# Patient Record
Sex: Female | Born: 1980 | Race: White | Hispanic: No | Marital: Married | Smoking: Never smoker
Health system: Southern US, Community
[De-identification: ages and names within clinical notes are randomized; demographics above are authoritative.]

## PROBLEM LIST (undated history)

## (undated) DIAGNOSIS — E119 Type 2 diabetes mellitus without complications: Secondary | ICD-10-CM

## (undated) DIAGNOSIS — F419 Anxiety disorder, unspecified: Secondary | ICD-10-CM

## (undated) DIAGNOSIS — E785 Hyperlipidemia, unspecified: Secondary | ICD-10-CM

## (undated) DIAGNOSIS — I10 Essential (primary) hypertension: Secondary | ICD-10-CM

## (undated) HISTORY — DX: Type 2 diabetes mellitus without complications: E11.9

## (undated) HISTORY — DX: Anxiety disorder, unspecified: F41.9

## (undated) HISTORY — DX: Essential (primary) hypertension: I10

## (undated) HISTORY — DX: Hyperlipidemia, unspecified: E78.5

---

## 2018-02-15 ENCOUNTER — Other Ambulatory Visit: Payer: Self-pay | Admitting: Obstetrics and Gynecology

## 2018-02-15 DIAGNOSIS — Z1231 Encounter for screening mammogram for malignant neoplasm of breast: Secondary | ICD-10-CM

## 2019-05-31 ENCOUNTER — Encounter: Payer: Self-pay | Admitting: *Deleted

## 2019-05-31 ENCOUNTER — Other Ambulatory Visit: Payer: Self-pay

## 2019-05-31 ENCOUNTER — Encounter: Payer: 59 | Attending: Certified Nurse Midwife | Admitting: *Deleted

## 2019-05-31 VITALS — BP 118/70 | Ht 65.0 in | Wt 260.7 lb

## 2019-05-31 DIAGNOSIS — E119 Type 2 diabetes mellitus without complications: Secondary | ICD-10-CM | POA: Diagnosis not present

## 2019-05-31 DIAGNOSIS — Z713 Dietary counseling and surveillance: Secondary | ICD-10-CM | POA: Diagnosis not present

## 2019-05-31 NOTE — Patient Instructions (Addendum)
Check blood sugars 1 x day before breakfast or 2 hrs after one meal 2-3 x week Bring blood sugar records to the next appointment  Exercise: Continue walking for 20 minutes  2-3 days a week and gradually increase to 30 minutes 5 x week  Eat 3 meals day, 1-2  snacks a day Space meals 4-6 hours apart Complete 3 Day Food Record and bring to next appt  Return for appointment on:  Monday June 24, 2019 at 8:30 am with the dietitian Jaclyn Shaggy)

## 2019-05-31 NOTE — Progress Notes (Signed)
Diabetes Self-Management Education  Visit Type: First/Initial  Appt. Start Time: 0850 Appt. End Time: 0955  05/31/2019  [name], identified by name and date of birth, is a 38 y.o. female with a diagnosis of Diabetes: Type 2.   ASSESSMENT  Blood pressure 118/70, height 5\' 5"  (1.651 m), weight 260 lb 11.2 oz (118.3 kg). Body mass index is 43.38 kg/m.  Diabetes Self-Management Education - 05/31/19 1046      Visit Information   Visit Type  First/Initial      Initial Visit   Diabetes Type  Type 2    Are you currently following a meal plan?  Yes    What type of meal plan do you follow?  "eating fruits/veggies, smaller portions"    Are you taking your medications as prescribed?  Yes    Date Diagnosed  "2 months" - A1C was 6.6 % on 05/29/18      Health Coping   How would you rate your overall health?  Good      Psychosocial Assessment   Patient Belief/Attitude about Diabetes  Other (comment)   "anxious, nervous"   Self-care barriers  None    Self-management support  Family;Doctor's office    Patient Concerns  Nutrition/Meal planning;Weight Control;Healthy Lifestyle    Special Needs  None    Preferred Learning Style  Auditory;Visual;Hands on    Learning Readiness  Change in progress    How often do you need to have someone help you when you read instructions, pamphlets, or other written materials from your doctor or pharmacy?  1 - Never    What is the last grade level you completed in school?  Bachelors      Pre-Education Assessment   Patient understands the diabetes disease and treatment process.  Needs Instruction    Patient understands incorporating nutritional management into lifestyle.  Needs Instruction    Patient undertands incorporating physical activity into lifestyle.  Needs Instruction    Patient understands using medications safely.  Needs Instruction    Patient understands monitoring blood glucose, interpreting and using results  Needs Review    Patient  understands prevention, detection, and treatment of acute complications.  Needs Instruction    Patient understands prevention, detection, and treatment of chronic complications.  Needs Instruction    Patient understands how to develop strategies to address psychosocial issues.  Needs Instruction    Patient understands how to develop strategies to promote health/change behavior.  Needs Instruction      Complications   Last HgB A1C per patient/outside source  6.7 %   04/05/2019   How often do you check your blood sugar?  3-4 times / week   Pt checking blood sugars 2-3 x week.   Fasting Blood glucose range (mg/dL)  [phone]   Pt reports fasting blood sugars 110's mg/dL.   Have you had a dilated eye exam in the past 12 months?  Yes    Have you had a dental exam in the past 12 months?  No    Are you checking your feet?  No      Dietary Intake   Breakfast  peanut butter on grain bread (toast), fruit (blueberries, pineapple, watermelon), eggs, yogurt    Snack (morning)  0-1 snacks - nuts, popcorn    Lunch  spaghettie squash with sauce, Lean Cuisine, left overs    Dinner  chicken, beef, pork - potatoes, beans, corn, rice, pasta, lettuce, tomatoes, occasional green beans, borccoli, cauliflower    Beverage(s)  water, no sugar energy drink      Exercise   Exercise Type  Light (walking / raking leaves)    How many days per week to you exercise?  2    How many minutes per day do you exercise?  20    Total minutes per week of exercise  40      Patient Education   Previous Diabetes Education  No    Disease state   Definition of diabetes, type 1 and 2, and the diagnosis of diabetes;Factors that contribute to the development of diabetes    Nutrition management   Role of diet in the treatment of diabetes and the relationship between the three main macronutrients and blood glucose level;Food label reading, portion sizes and measuring food.;Reviewed blood glucose goals for pre and post meals and how to  evaluate the patients' food intake on their blood glucose level.    Physical activity and exercise   Role of exercise on diabetes management, blood pressure control and cardiac health.    Monitoring  Purpose and frequency of SMBG.;Taught/discussed recording of test results and interpretation of SMBG.;Identified appropriate SMBG and/or A1C goals.    Chronic complications  Relationship between chronic complications and blood glucose control    Psychosocial adjustment  Identified and addressed patients feelings and concerns about diabetes    Preconception care  Pregnancy and GDM  Role of pre-pregnancy blood glucose control on the development of the fetus;Reviewed with patient blood glucose goals with pregnancy;Role of family planning for patients with diabetes   Pt is no longer on birth control and wants to get pregnant.     Individualized Goals (developed by patient)   Reducing Risk Lose weight Lead a healthier lifestyle     Outcomes   Expected Outcomes  Demonstrated interest in learning. Expect positive outcomes       Individualized Plan for Diabetes Self-Management Training:   Learning Objective:  Patient will have a greater understanding of diabetes self-management. Patient education plan is to attend individual and/or group sessions per assessed needs and concerns.   Plan:   Patient Instructions  Check blood sugars 1 x day before breakfast or 2 hrs after one meal 2-3 x week Bring blood sugar records to the next appointment Exercise: Continue walking for 20 minutes  2-3 days a week and gradually increase to 30 minutes 5 x week Eat 3 meals day, 1-2  snacks a day Space meals 4-6 hours apart Complete 3 Day Food Record and bring to next appt Return for appointment on:  Monday June 24, 2019 at 8:30 am with the dietitian Jaclyn Shaggy)  Expected Outcomes:  Demonstrated interest in learning. Expect positive outcomes  Education material provided:  General Meal Planning Guidelines Simple  Meal Plan 3 Day Food Record  If problems or questions, patient to contact team via:  Johny Drilling, RN, [place], CDE [phone]  Future DSME appointment:  June 24, 2019 with the dietitian

## 2019-06-24 ENCOUNTER — Encounter: Payer: Self-pay | Admitting: Dietician

## 2019-06-24 ENCOUNTER — Encounter: Payer: 59 | Attending: Certified Nurse Midwife | Admitting: Dietician

## 2019-06-24 ENCOUNTER — Other Ambulatory Visit: Payer: Self-pay

## 2019-06-24 VITALS — Ht 65.0 in | Wt 264.7 lb

## 2019-06-24 DIAGNOSIS — Z713 Dietary counseling and surveillance: Secondary | ICD-10-CM | POA: Diagnosis present

## 2019-06-24 DIAGNOSIS — E119 Type 2 diabetes mellitus without complications: Secondary | ICD-10-CM | POA: Insufficient documentation

## 2019-06-24 NOTE — Patient Instructions (Addendum)
Balance meals with 2-4 oz protein, 2-4 servings of carbohydrate (30-60 gms) and non-starchy vegetables. Spread 10-12 servings of carbohydrate over 3 meals and 2-3 small snacks. Read labels for carbohydrate. Measure out starch servings for several meals to be better able to judge amount. Use calorieking.com to look up especially  "take out" foods. Prepare more meals at home. Check blood sugars at least 3-4 days per week; fasting and 2 hours after dinner meals.

## 2019-06-24 NOTE — Progress Notes (Signed)
Diabetes Self-Management Education  Visit Type:  Follow-up  Appt. Start Time: 8:40   Appt. End Time: 9:40  06/24/2019  [name], identified by name and date of birth, is a 38 y.o. female with a diagnosis of Diabetes:Type 2 Diabetes ASSESSMENT  Height 5\' 5"  (1.651 m), weight 264 lb 11.2 oz (120.1 kg). Body mass index is 44.05 kg/m.   Diabetes Self-Management Education - 06/24/19 1046      Complications   Last HgB A1C per patient/outside source  6.7 %    How often do you check your blood sugar?  --   once weekly   Fasting Blood glucose range (mg/dL)  [phone]   based on 2 fasting blood sugars   Postprandial Blood glucose range (mg/dL)  [phone]   one reading   Have you had a dilated eye exam in the past 12 months?  No    Have you had a dental exam in the past 12 months?  No    Are you checking your feet?  No      Dietary Intake   Breakfast  9:30am- cereal/milk (1.5 -2 cups), cottage cheese, blueberries/cherries, water    Snack (morning)  usually no snack    Lunch  2:00pm- 3-4 oz chicken breast with b'que sauce, broccoli, hibachi soup, water or chipotle chicken bowl with rice and tortilla chips    Snack (afternoon)  2 handfuls chex mix    Dinner  8-9:00 individual "frozen pizza" or hibachi chicken with rice and vegetables.    Snack (evening)  no snack    Beverage(s)  water with sugar free flavored packets      Exercise   Exercise Type  Light (walking / raking leaves)    How many days per week to you exercise?  3    How many minutes per day do you exercise?  20    Total minutes per week of exercise  60      Patient Education   Nutrition management   Role of diet in the treatment of diabetes and the relationship between the three main macronutrients and blood glucose level;Food label reading, portion sizes and measuring food.;Carbohydrate counting;Reviewed blood glucose goals for pre and post meals and how to evaluate the patients' food intake on their blood glucose  level.;Effects of alcohol on blood glucose and safety factors with consumption of alcohol.    Physical activity and exercise   Role of exercise on diabetes management, blood pressure control and cardiac health.    Monitoring  Purpose and frequency of SMBG.;Taught/discussed recording of test results and interpretation of SMBG.;Identified appropriate SMBG and/or A1C goals.    Preconception care  Role of family planning for patients with diabetes;Pregnancy and GDM  Role of pre-pregnancy blood glucose control on the development of the fetus      Individualized Goals (developed by patient)   Nutrition  Follow meal plan discussed;General guidelines for healthy choices and portions discussed    Physical Activity  Exercise 3-5 times per week    Monitoring   test my blood glucose as discussed      Post-Education Assessment   Patient understands the diabetes disease and treatment process.  Demonstrates understanding / competency    Patient understands incorporating nutritional management into lifestyle.  Demonstrates understanding / competency    Patient undertands incorporating physical activity into lifestyle.  Demonstrates understanding / competency    Patient understands monitoring blood glucose, interpreting and using results  Demonstrates understanding / competency    Patient  understands prevention, detection, and treatment of acute complications.  Demonstrates understanding / competency    Patient understands how to develop strategies to promote health/change behavior.  Demonstrates understanding / competency      Outcomes   Program Status  Completed       Learning Objective:  Patient will have a greater understanding of diabetes self-management. Patient education plan is to attend individual and/or group sessions per assessed needs and concerns. Education: Patient was instructed on a meal plan for diabetes based on 1700 calories including carbohydrate counting and how to better balance  carbohydrate protein, fat and non-starchy vegetables. She stated that she is trying to get pregnant. Stressed important nutrients for pregnancy including protein, calcium, iron and folic acid and instructed on food sources. Also, stressed importance of good glucose control at time of conception as well as throughout pregnancy and stressed importance of monitoring blood glucose. Reviewed blood glucose goals. Reviewed food record with patient and discussed ways to adjust choices and portions to help improve blood glucose.   Patient Instructions  Balance meals with 2-4 oz protein, 2-4 servings of carbohydrate (30-60 gms) and non-starchy vegetables. Spread 10-12 servings of carbohydrate over 3 meals and 2-3 small snacks. Read labels for carbohydrate. Measure out starch servings for several meals to be better able to judge amount. Use calorieking.com to look up especially  "take out" foods. Prepare more meals at home. Check blood sugars at least 3-4 days per week; fasting and 2 hours after dinner meals.   Expected Outcomes:  Demonstrated interest in learning. Expect positive outcomes  Education material provided: Food Guide Plate Planning a Balanced Meal Combination Foods Food Safety during pregnancy Key Nutrients for Healthy Pregnancy  If problems or questions, patient to contact team via:  Karolee Stamps, [place] Future DSME appointment: -  None scheduled. Patient was encouraged to call if desires further help with her diet/nutrition.

## 2020-08-26 ENCOUNTER — Emergency Department: Payer: 59

## 2020-08-26 ENCOUNTER — Emergency Department
Admission: EM | Admit: 2020-08-26 | Discharge: 2020-08-26 | Disposition: A | Discharge status: Home / Self Care | Payer: 59 | Attending: Emergency Medicine | Admitting: Emergency Medicine

## 2020-08-26 ENCOUNTER — Other Ambulatory Visit: Payer: Self-pay

## 2020-08-26 ENCOUNTER — Encounter: Payer: Self-pay | Admitting: Emergency Medicine

## 2020-08-26 DIAGNOSIS — O26891 Other specified pregnancy related conditions, first trimester: Secondary | ICD-10-CM | POA: Diagnosis not present

## 2020-08-26 DIAGNOSIS — Z3A01 Less than 8 weeks gestation of pregnancy: Secondary | ICD-10-CM | POA: Insufficient documentation

## 2020-08-26 DIAGNOSIS — Z79899 Other long term (current) drug therapy: Secondary | ICD-10-CM | POA: Insufficient documentation

## 2020-08-26 DIAGNOSIS — O24419 Gestational diabetes mellitus in pregnancy, unspecified control: Secondary | ICD-10-CM | POA: Diagnosis not present

## 2020-08-26 DIAGNOSIS — Z3491 Encounter for supervision of normal pregnancy, unspecified, first trimester: Secondary | ICD-10-CM

## 2020-08-26 DIAGNOSIS — O10011 Pre-existing essential hypertension complicating pregnancy, first trimester: Secondary | ICD-10-CM | POA: Insufficient documentation

## 2020-08-26 DIAGNOSIS — R109 Unspecified abdominal pain: Secondary | ICD-10-CM

## 2020-08-26 DIAGNOSIS — R1032 Left lower quadrant pain: Secondary | ICD-10-CM | POA: Diagnosis not present

## 2020-08-26 LAB — URINALYSIS, COMPLETE (UACMP) WITH MICROSCOPIC
Bilirubin Urine: NEGATIVE
Glucose, UA: NEGATIVE mg/dL
Hgb urine dipstick: NEGATIVE
Ketones, ur: 20 mg/dL — AB
Leukocytes,Ua: NEGATIVE
Nitrite: NEGATIVE
Protein, ur: 30 mg/dL — AB
Specific Gravity, Urine: 1.03 (ref 1.005–1.030)
pH: 5 (ref 5.0–8.0)

## 2020-08-26 LAB — COMPREHENSIVE METABOLIC PANEL
ALT: 45 U/L — ABNORMAL HIGH (ref 0–44)
AST: 27 U/L (ref 15–41)
Albumin: 4.4 g/dL (ref 3.5–5.0)
Alkaline Phosphatase: 50 U/L (ref 38–126)
Anion gap: 12 (ref 5–15)
BUN: 15 mg/dL (ref 6–20)
CO2: 20 mmol/L — ABNORMAL LOW (ref 22–32)
Calcium: 9.7 mg/dL (ref 8.9–10.3)
Chloride: 103 mmol/L (ref 98–111)
Creatinine, Ser: 0.81 mg/dL (ref 0.44–1.00)
GFR, Estimated: >60 mL/min (ref >60)
Glucose, Bld: 178 mg/dL — ABNORMAL HIGH (ref 70–99)
Potassium: 4 mmol/L (ref 3.5–5.1)
Sodium: 135 mmol/L (ref 135–145)
Total Bilirubin: 0.8 mg/dL (ref 0.3–1.2)
Total Protein: 8.3 g/dL — ABNORMAL HIGH (ref 6.5–8.1)

## 2020-08-26 LAB — CBC
HCT: 41.8 % (ref 36.0–46.0)
Hemoglobin: 14 g/dL (ref 12.0–15.0)
MCH: 29.5 pg (ref 26.0–34.0)
MCHC: 33.5 g/dL (ref 30.0–36.0)
MCV: 88 fL (ref 80.0–100.0)
Platelets: 339 10*3/uL (ref 150–400)
RBC: 4.75 MIL/uL (ref 3.87–5.11)
RDW: 13.2 % (ref 11.5–15.5)
WBC: 17 10*3/uL — ABNORMAL HIGH (ref 4.0–10.5)
nRBC: 0 % (ref 0.0–0.2)

## 2020-08-26 LAB — HCG, QUANTITATIVE, PREGNANCY: hCG, Beta Chain, Quant, S: 45465 m[IU]/mL — ABNORMAL HIGH (ref <5)

## 2020-08-26 LAB — POCT PREGNANCY, URINE: Preg Test, Ur: POSITIVE — AB

## 2020-08-26 IMAGING — US US OB < 14 WEEKS - US OB TV
1 series · 14 of 28 positions shown · non-contrast
Comparison: None.

CLINICAL DATA: Left upper quadrant, flank pain

EXAM:
OBSTETRIC <14 WK US AND TRANSVAGINAL OB US
TECHNIQUE: Both transabdominal and transvaginal ultrasound examinations were
performed for complete evaluation of the gestation as well as the
maternal uterus, adnexal regions, and pelvic cul-de-sac.
Transvaginal technique was performed to assess early pregnancy.

[Series 1: us ob less than 14 weeks with ob transvaginal · 88 acquisitions, 14 frames shown]
[im 4/88]
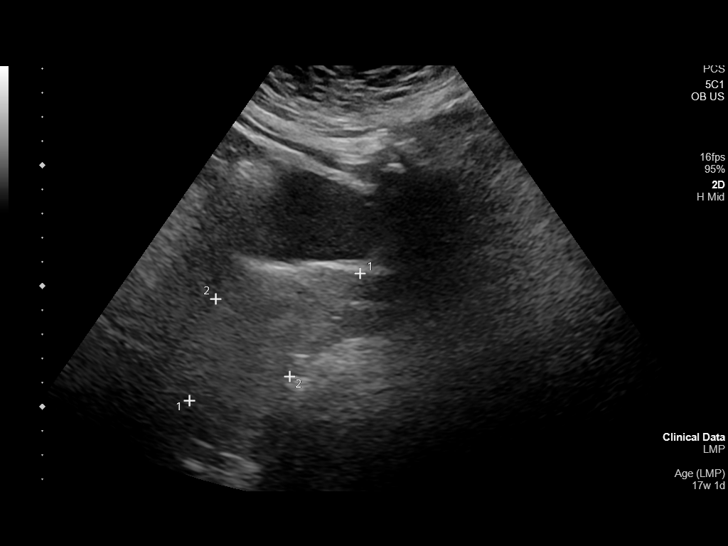
[im 10/88]
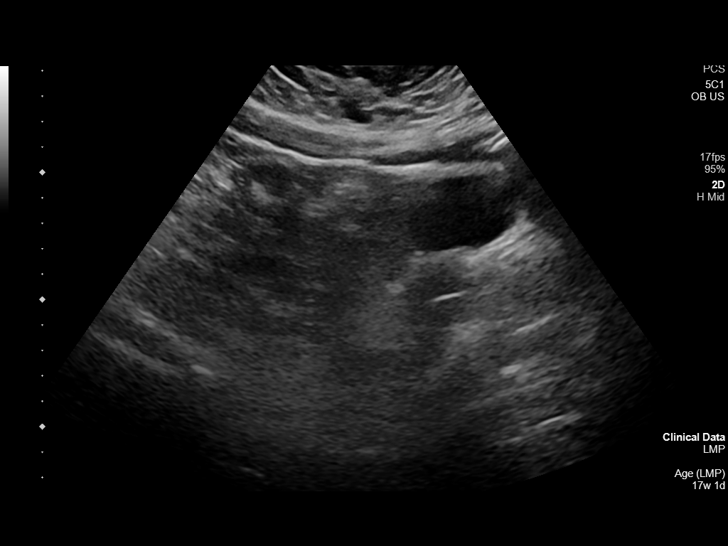
[im 17/88]
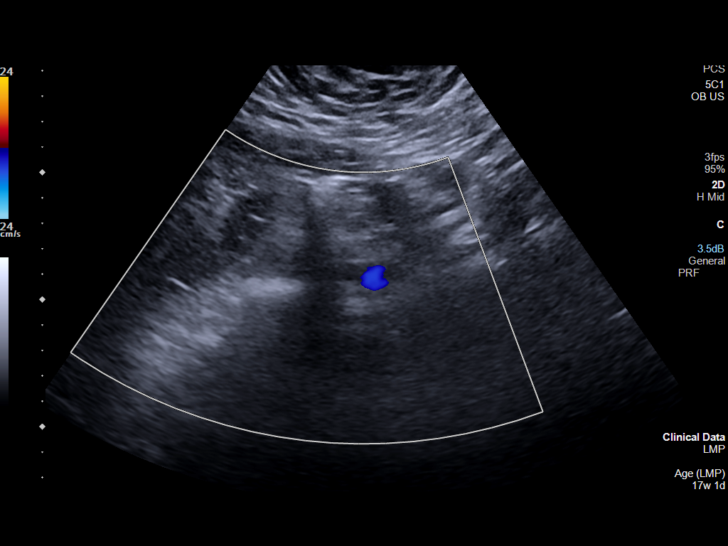
[im 23/88]
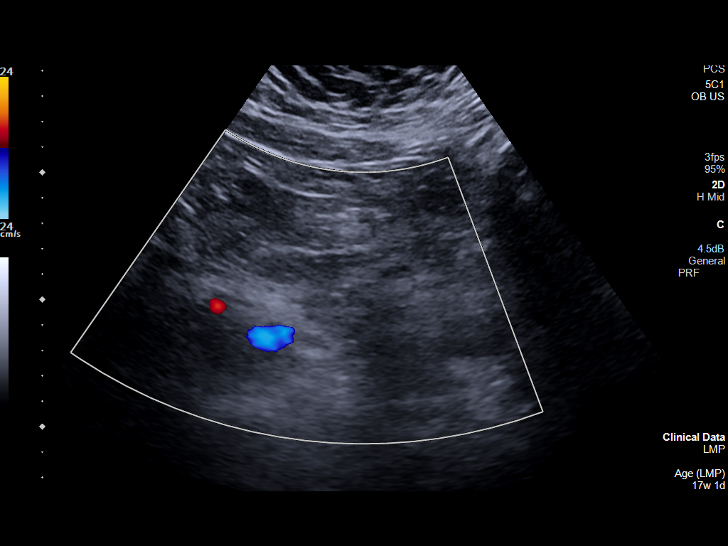
[im 30/88]
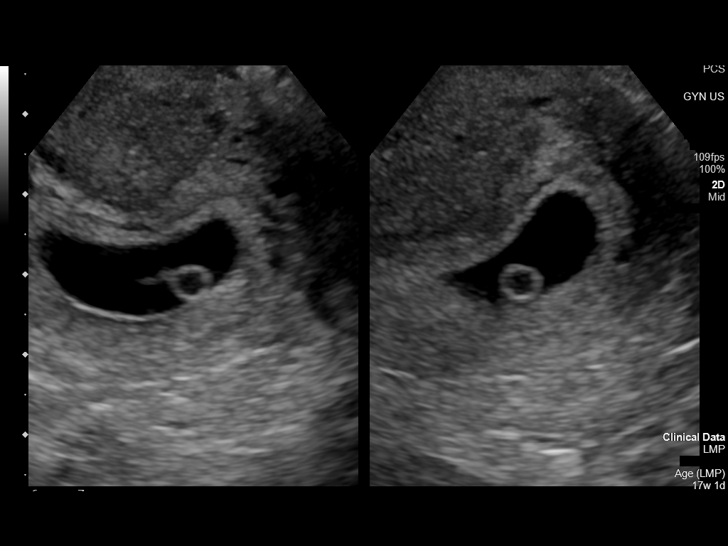
[im 36/88]
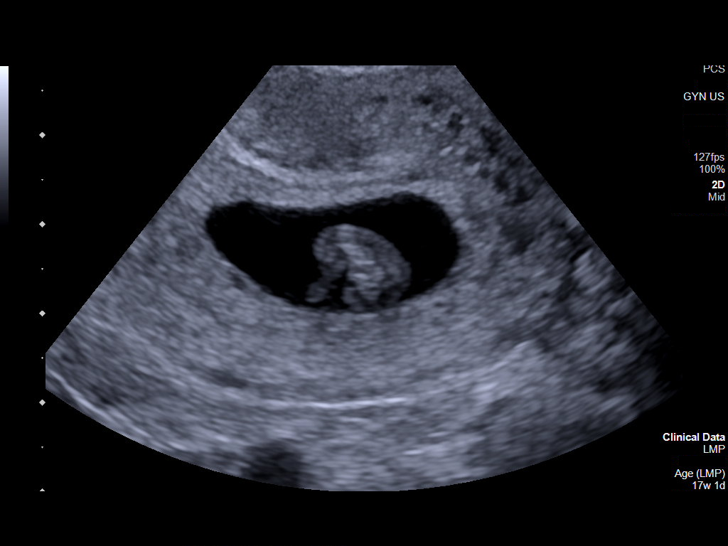
[im 42/88]
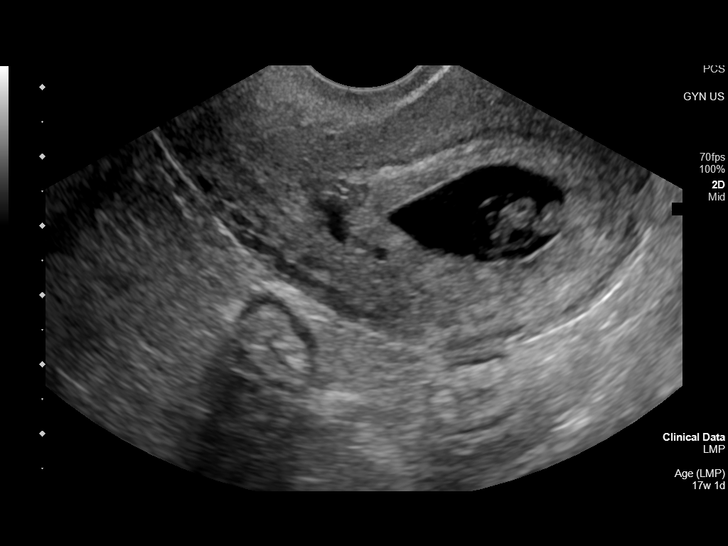
[im 49/88]
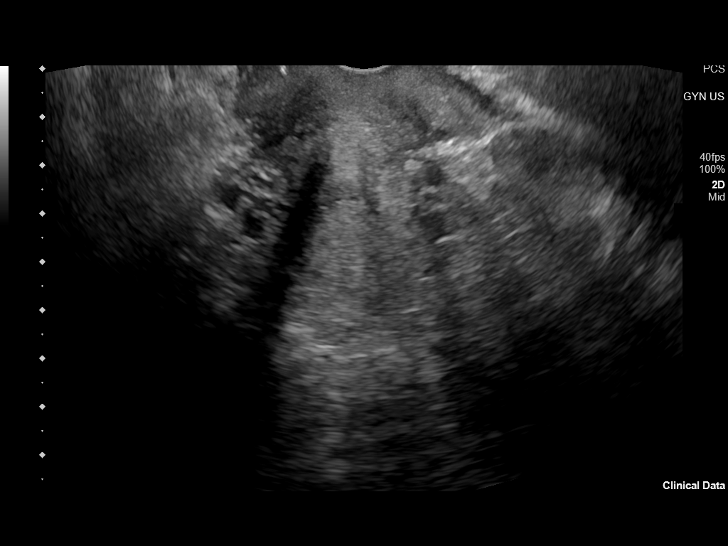
[im 55/88]
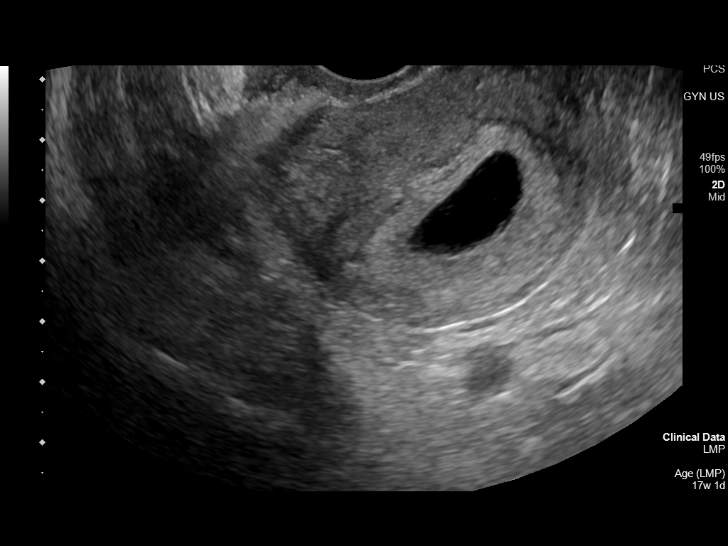
[im 62/88]
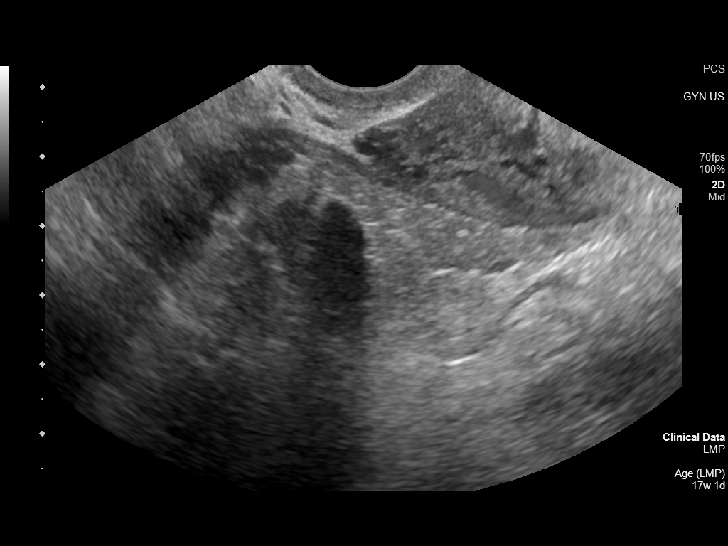
[im 68/88]
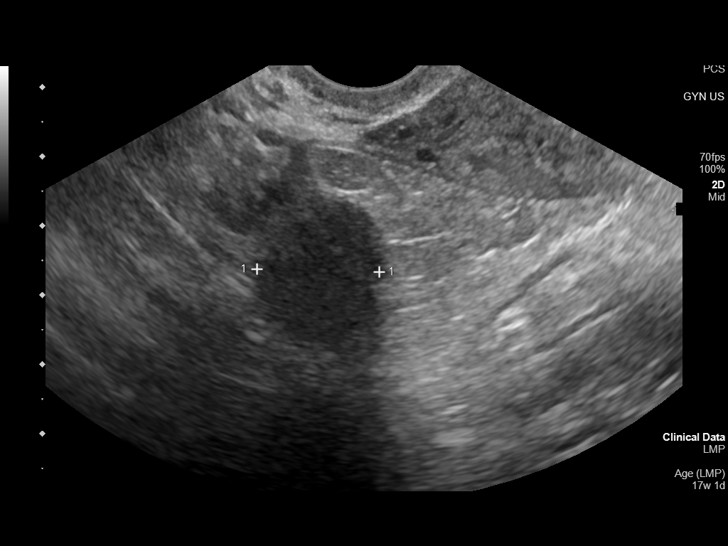
[im 75/88]
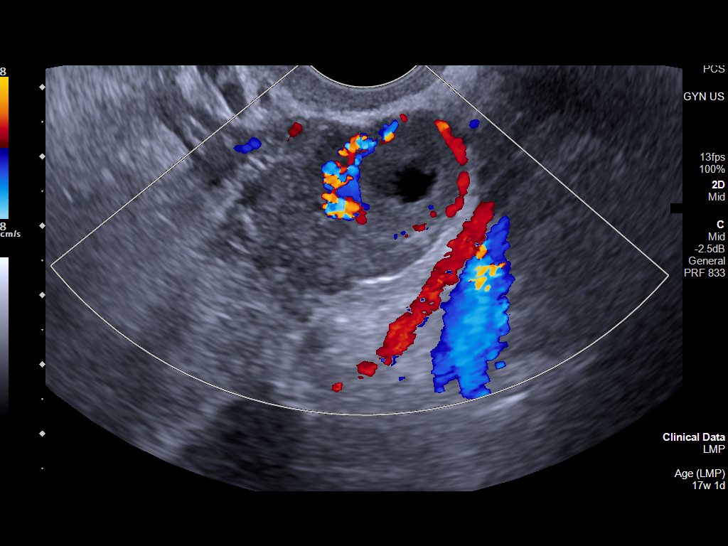
[im 81/88]
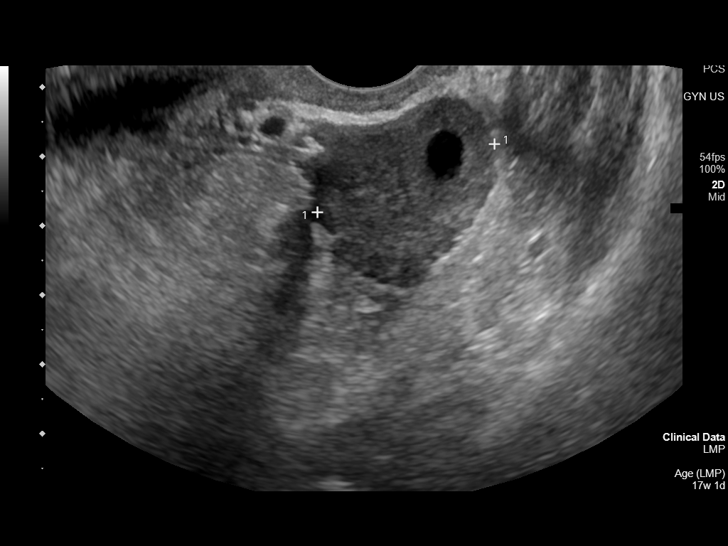
[im 88/88]
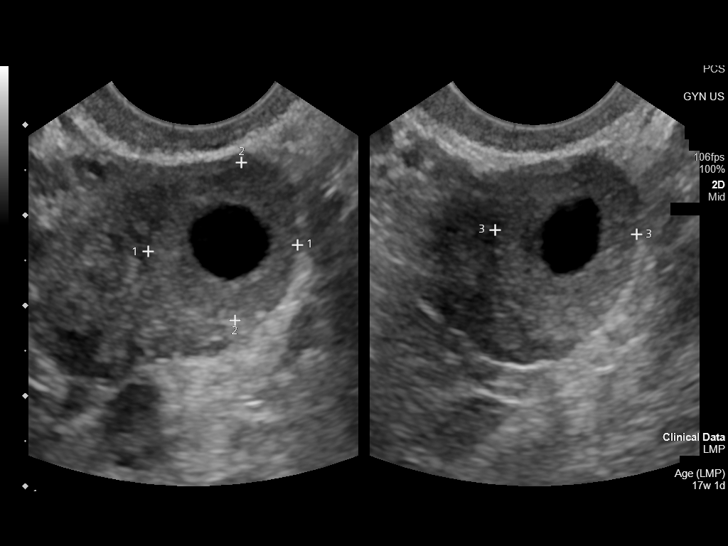

[14 of 28 positions shown; findings below may reference images not displayed]

FINDINGS: Intrauterine gestational sac: Single

Yolk sac:  Visualized.

Embryo:  Visualized.

Cardiac Activity: Visualized.

Heart Rate: 155 bpm

CRL:  11.4 mm   7 w   2 d                  US EDC: [DATE]

Subchorionic hemorrhage:  Small volume subchorionic hemorrhage.

Maternal uterus/adnexae: Retroverted uterus. No other significant
maternal uterine abnormality. Normal appearance of the right ovary.
Thick-walled cyst in the left ovary with peripheral color flow
measuring up to 1.8 cm, likely corpus luteum. No concerning adnexal
lesions. No free fluid.
IMPRESSION: Single intrauterine gestation at 7 weeks, 2 days gestational age by
crown-rump length sonographic estimation.

Small volume subchorionic hemorrhage.

Retroverted uterus.

## 2020-08-26 IMAGING — US US RENAL
1 series · 15 of 25 positions shown · non-contrast
Comparison: None.

CLINICAL DATA: Left flank pain, urinary frequency

EXAM:
RENAL / URINARY TRACT ULTRASOUND COMPLETE

[Series 1: us renal · 15 of 43 slices shown]
[im 1/43]
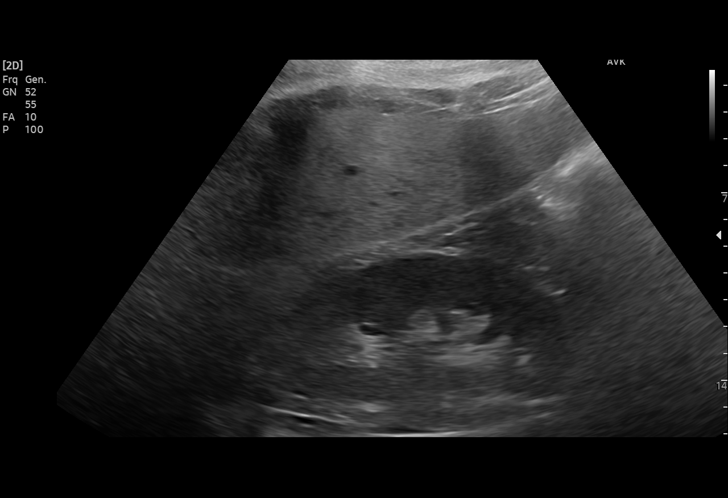
[im 4/43]
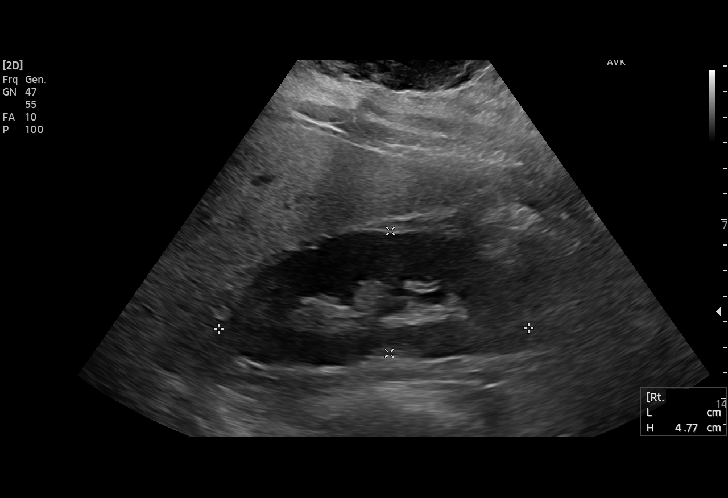
[im 8/43]
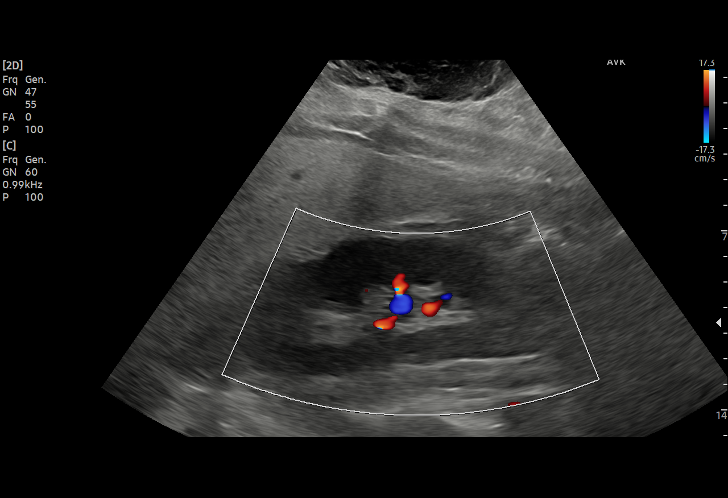
[im 9/43]
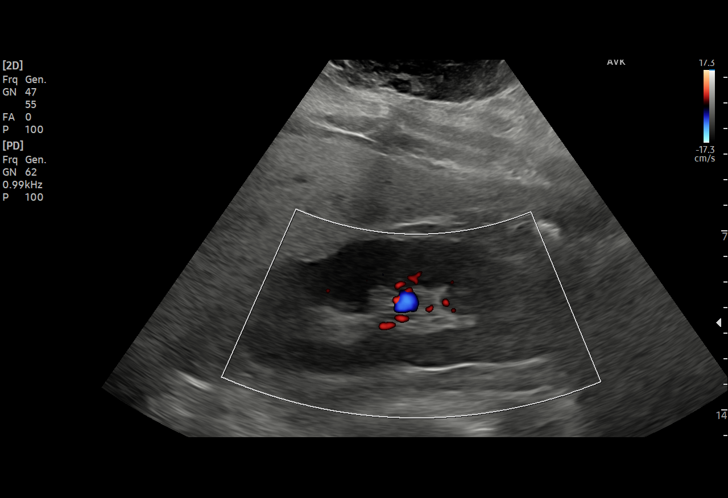
[im 13/43]
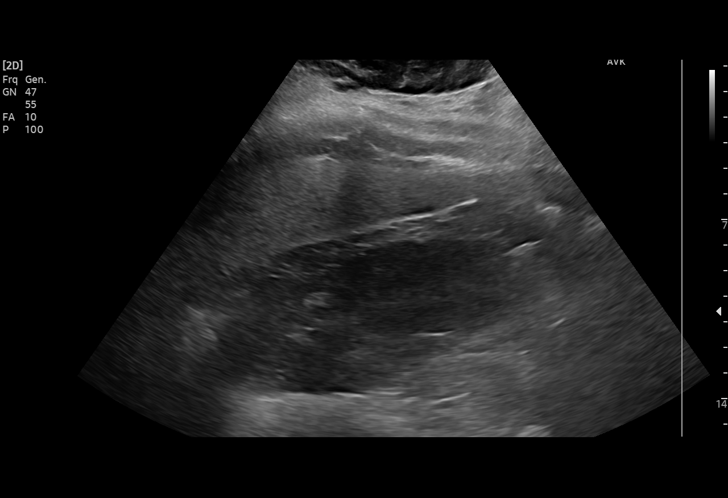
[im 16/43]
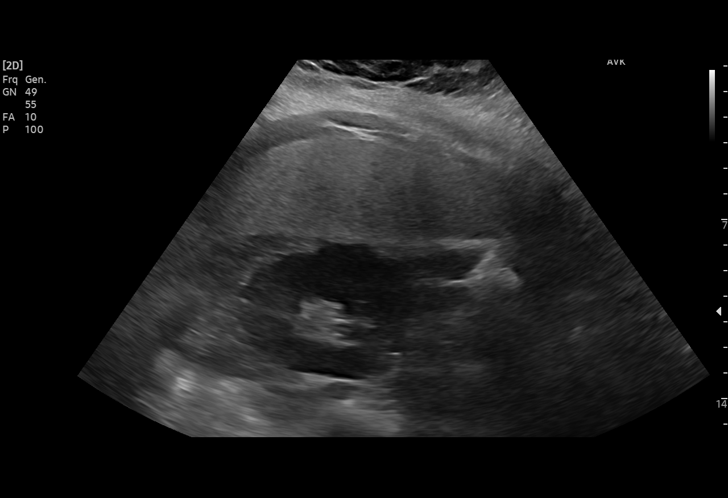
[im 18/43]
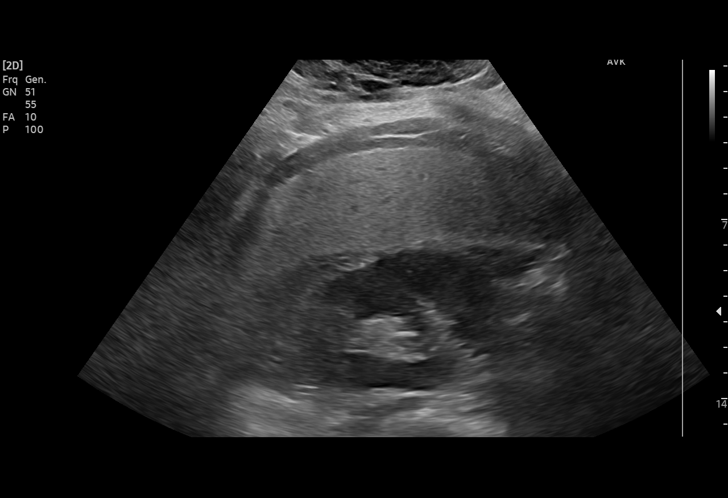
[im 22/43]
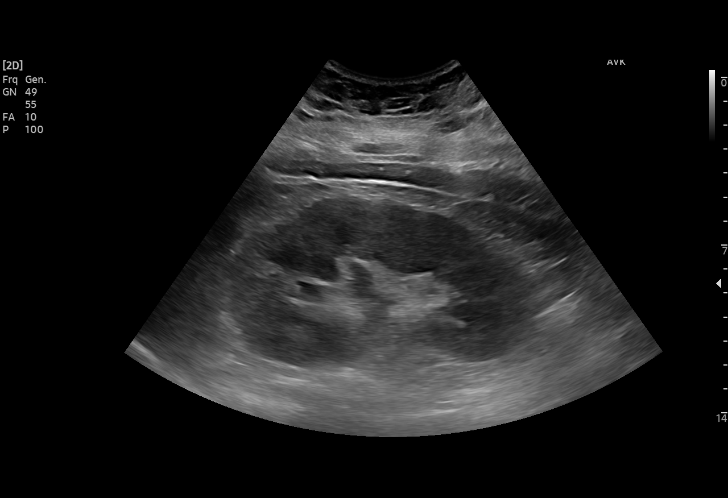
[im 25/43]
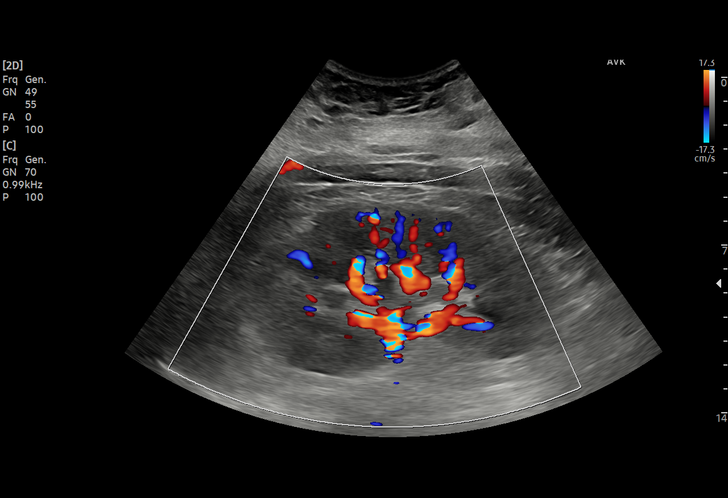
[im 27/43]
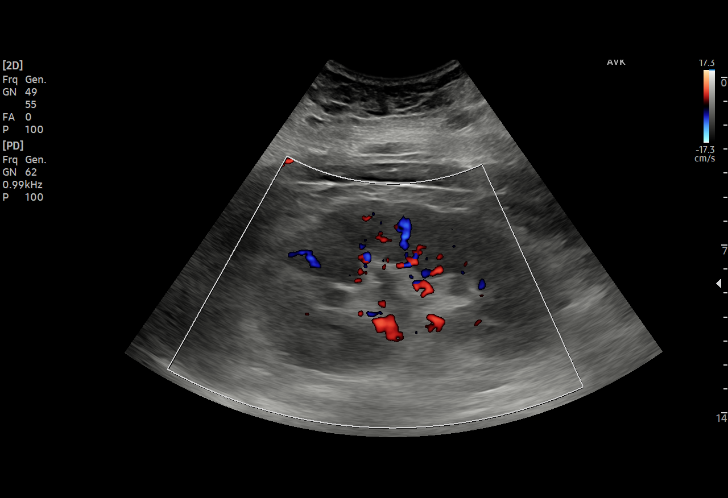
[im 30/43]
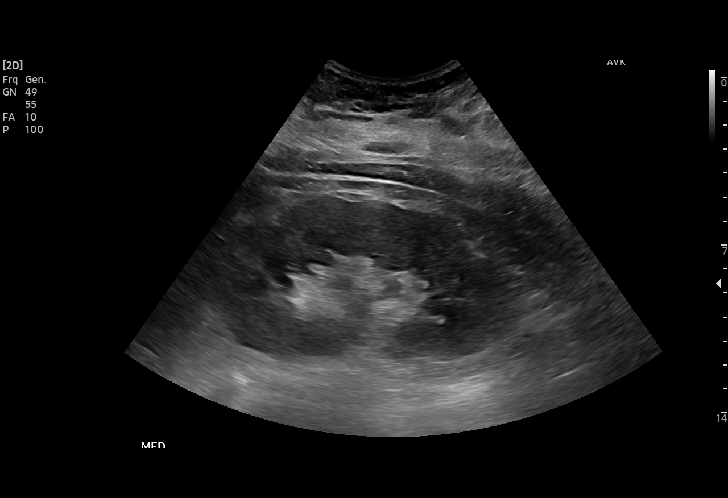
[im 34/43]
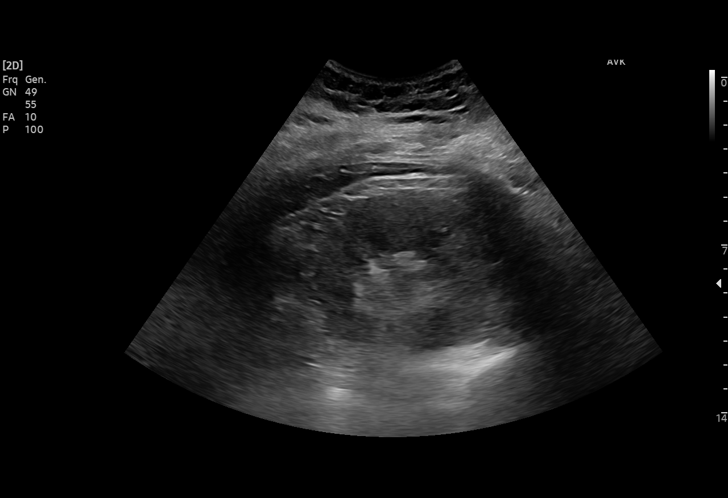
[im 36/43]
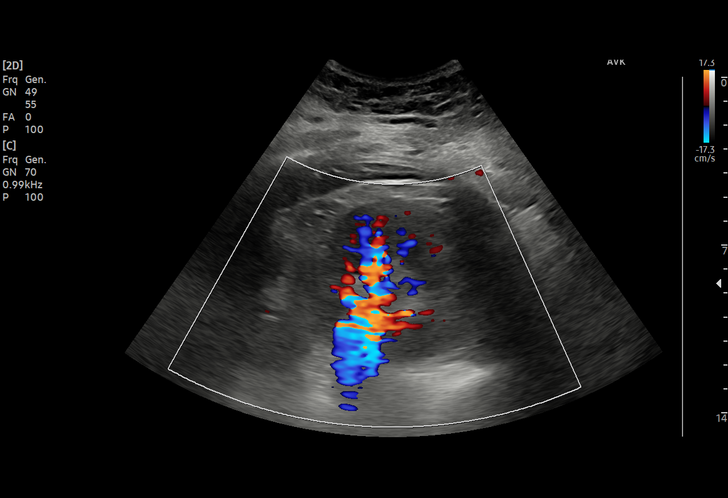
[im 39/43]
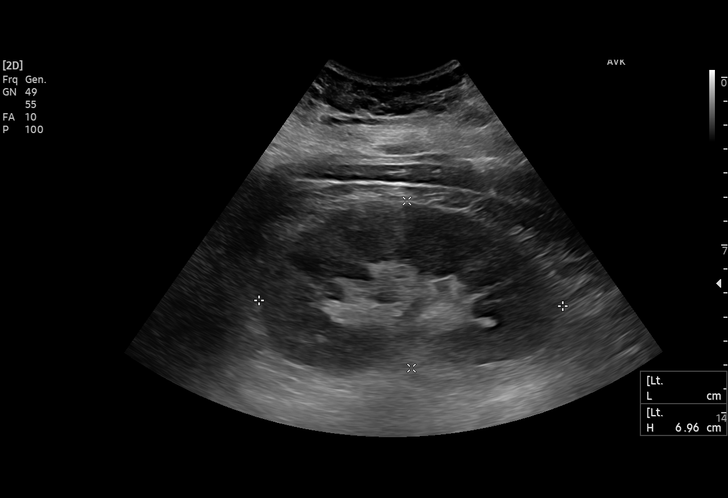
[im 43/43]
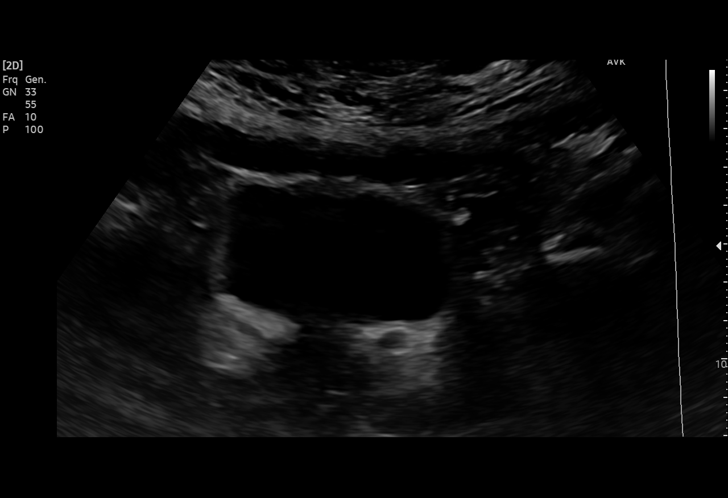

[15 of 25 positions shown; findings below may reference images not displayed]

FINDINGS: Right Kidney:

Renal measurements: 12.1 x 4.8 x 6.5 cm = volume: 195 mL.
Echogenicity within normal limits. No mass, shadowing stone, or
hydronephrosis visualized.

Left Kidney:

Renal measurements: 12.6 x 7.0 x 6.1 cm = volume: 282 mL.
Echogenicity within normal limits. No mass, shadowing stone, or
hydronephrosis visualized.

Bladder:

Appears normal for degree of bladder distention.

Other:

The visualized portion of the hepatic parenchyma appears diffusely
echogenic.
IMPRESSION: 1. Normal sonographic appearance of the kidneys and bladder without
evidence of obstructive uropathy.
2. Visualized portion of the liver is diffusely echogenic. Findings
are nonspecific but most commonly seen with hepatic steatosis.

## 2020-08-26 IMAGING — MR MR ABDOMEN W/O CM
1 series · 48 of 48 positions shown · non-contrast
Comparison: None.

CLINICAL DATA: Left lower quadrant abdominal pain

EXAM:
MRI ABDOMEN AND PELVIS WITHOUT CONTRAST
TECHNIQUE: Multiplanar multisequence MR imaging of the abdomen and pelvis was
performed. No intravenous contrast was administered.

[Series 8: T1 dynamic fat-sat · axial · 3.0mm · 1.12mm/px · z∈[-299,+196]mm · 48 of 176 slices shown]
[im 1/176]
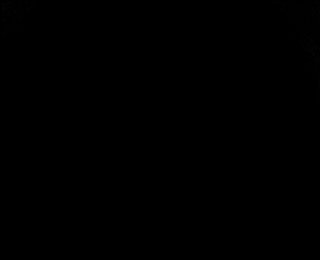
[im 4/176]
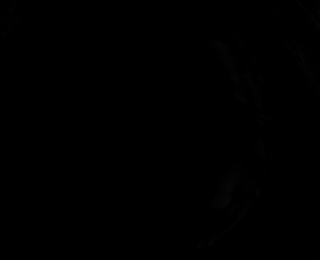
[im 8/176]
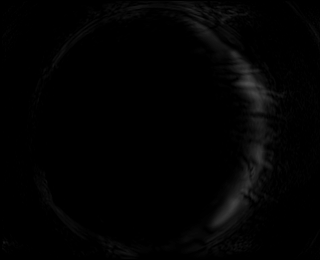
[im 12/176]
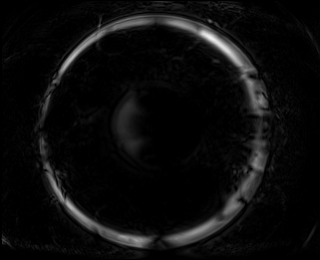
[im 15/176]
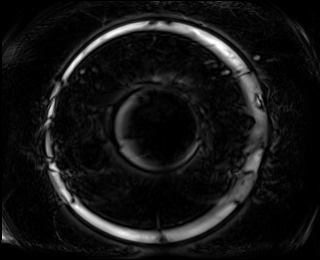
[im 19/176]
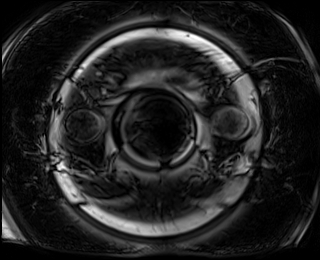
[im 23/176]
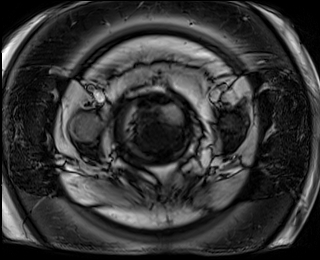
[im 27/176]
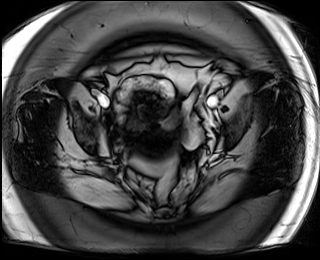
[im 30/176]
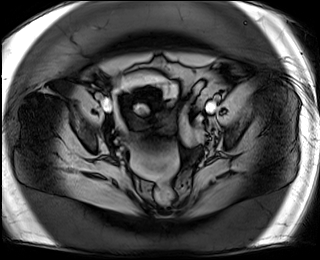
[im 34/176]
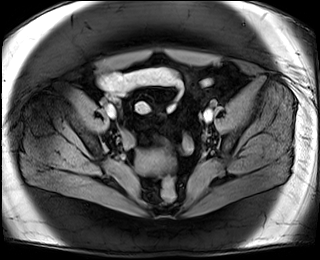
[im 38/176]
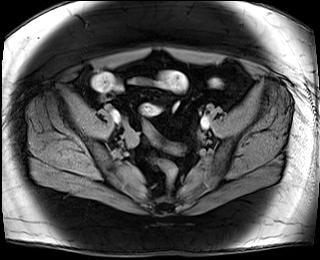
[im 41/176]
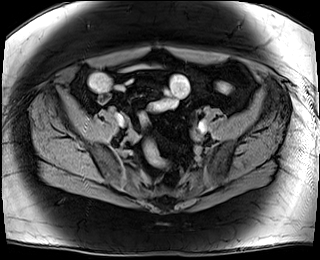
[im 45/176]
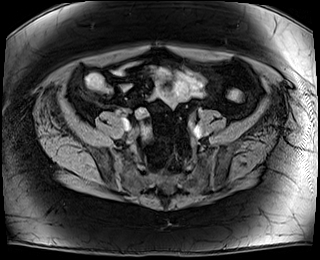
[im 49/176]
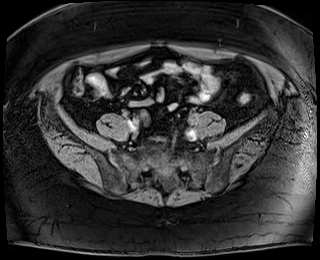
[im 53/176]
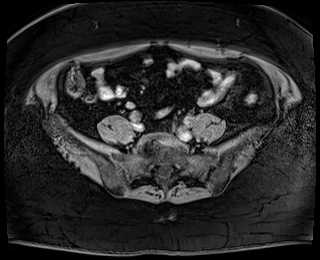
[im 56/176]
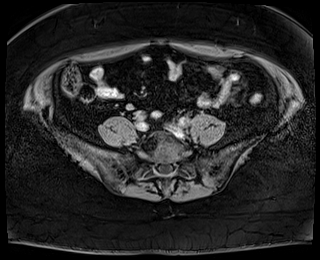
[im 60/176]
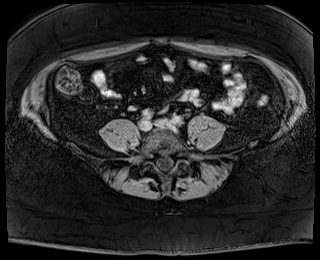
[im 64/176]
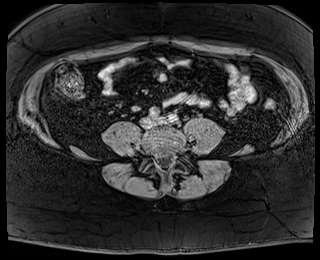
[im 68/176]
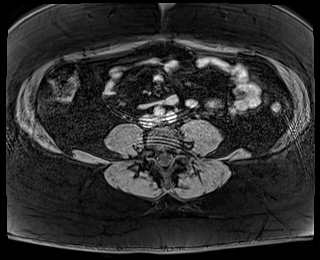
[im 71/176]
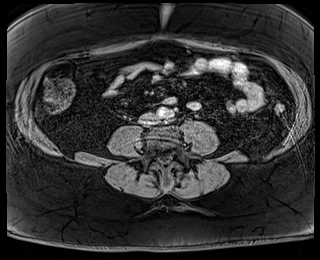
[im 75/176]
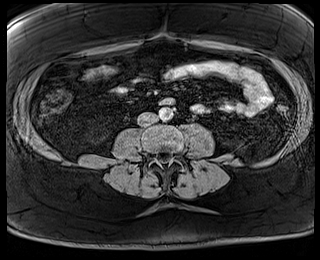
[im 79/176]
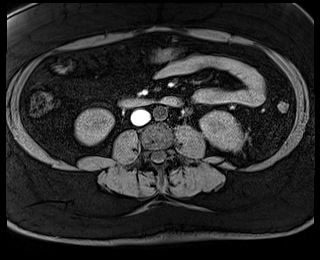
[im 82/176]
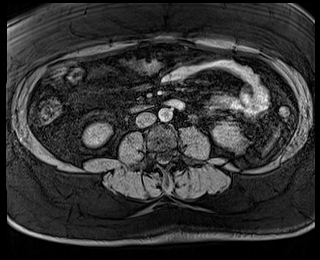
[im 86/176]
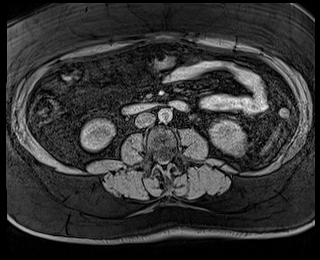
[im 90/176]
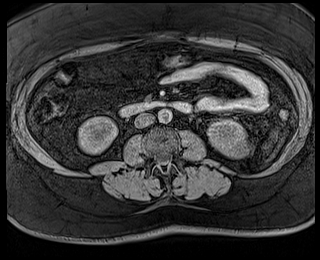
[im 94/176]
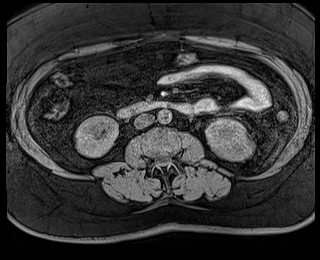
[im 97/176]
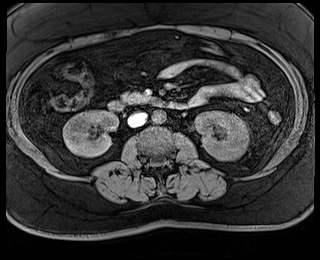
[im 101/176]
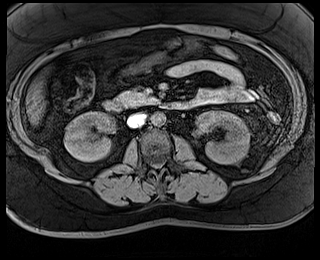
[im 105/176]
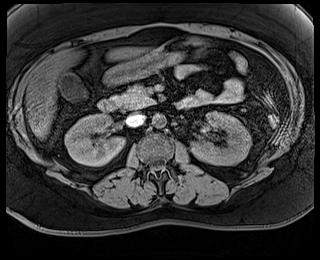
[im 108/176]
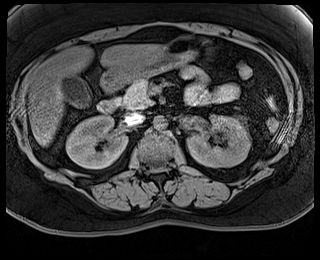
[im 112/176]
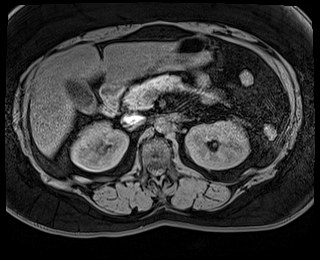
[im 116/176]
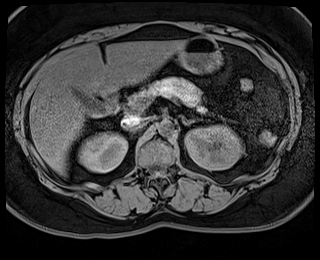
[im 120/176]
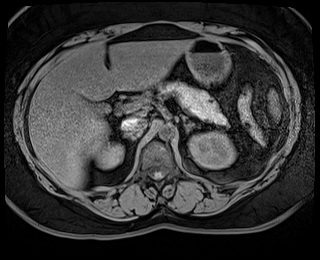
[im 123/176]
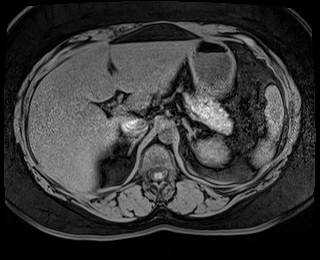
[im 127/176]
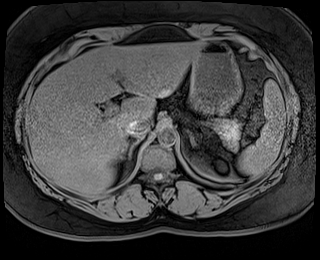
[im 131/176]
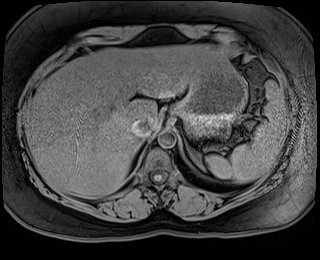
[im 135/176]
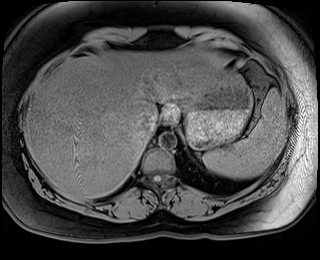
[im 138/176]
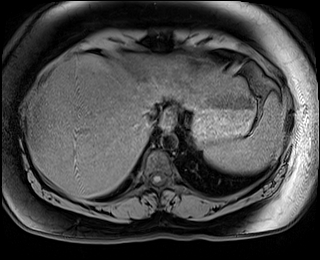
[im 142/176]
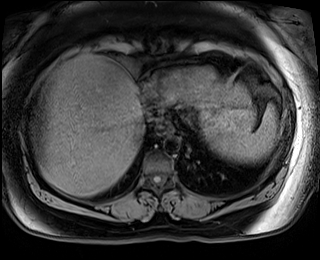
[im 146/176]
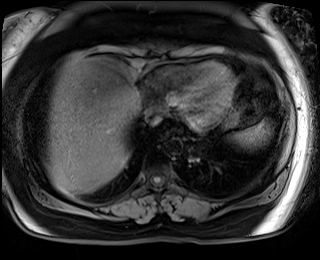
[im 149/176]
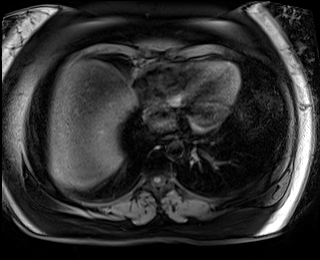
[im 153/176]
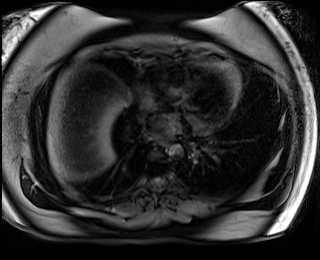
[im 157/176]
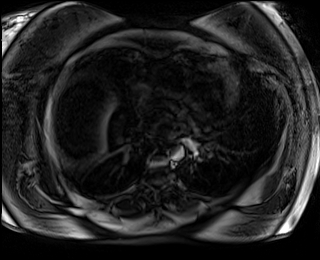
[im 161/176]
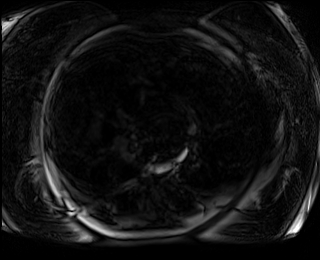
[im 164/176]
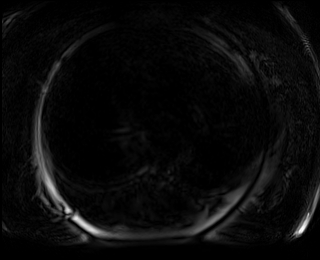
[im 168/176]
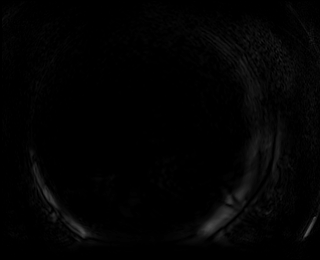
[im 172/176]
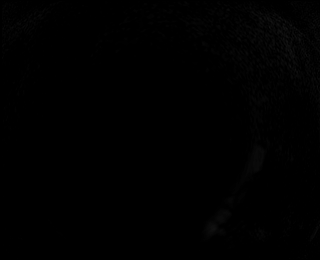
[im 176/176]
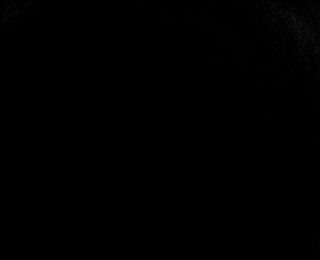

[48 of 48 positions shown; findings below may reference images not displayed]

FINDINGS: COMBINED FINDINGS FOR BOTH MR ABDOMEN AND PELVIS

Lower chest: No acute findings.

Hepatobiliary: No mass or other parenchymal abnormality identified.
The gallbladder is unremarkable. No gallstones identified. No
gallbladder wall thickening or biliary ductal dilatation.

Pancreas: No pancreatic inflammation, mass, or main duct dilatation.

Spleen:  Within normal limits in size and appearance.

Adrenals/Urinary Tract:  Normal appearance of the adrenal glands.

There is a normal appearance of the right kidney. Asymmetric left
nephromegaly, mild hydronephrosis and perinephric fat stranding.
Left hydroureter to the urinary bladder is noted.

Stomach/Bowel: Stomach is normal. The appendix is visualized and is
unremarkable. No bowel wall thickening, inflammation or distension.

Vascular/Lymphatic: No pathologically enlarged lymph nodes
identified. No abdominal aortic aneurysm demonstrated.

Reproductive: Gravid uterus containing gestational sac is
identified. Possible fetal pole within the gestational sac measuring
approximately 6 mm. No adnexal mass.

Other:  No free fluid or fluid collection

Musculoskeletal: No suspicious bone lesions identified.
IMPRESSION: 1. Normal appendix.
2. Asymmetric left nephromegaly, mild hydronephrosis, perinephric
fat stranding and hydroureter to the level of the urinary bladder.
Findings are likely secondary to either ureteral calculi or
pyelonephritis. In the absence of signs or symptoms of infection,
small distal ureteral calculi or recently passed calculi is
suspected. Careful clinical correlation advised.
3. Gravid uterus containing gestational sac. Possible fetal pole
within the gestational sac measuring approximately 6 mm.

## 2020-08-26 IMAGING — MR MR PELVIS W/O CM
8 of 11 series · 34 of 48 positions shown · non-contrast
Comparison: None.

CLINICAL DATA: Left lower quadrant abdominal pain

EXAM:
MRI ABDOMEN AND PELVIS WITHOUT CONTRAST
TECHNIQUE: Multiplanar multisequence MR imaging of the abdomen and pelvis was
performed. No intravenous contrast was administered.

[Series 4: cor haste · coronal · 5.0mm · 1.25mm/px · 4 of 44 slices shown]
[im 1/44]
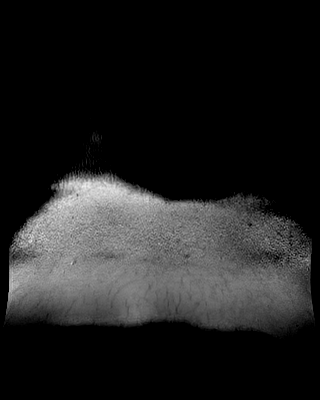
[im 15/44]
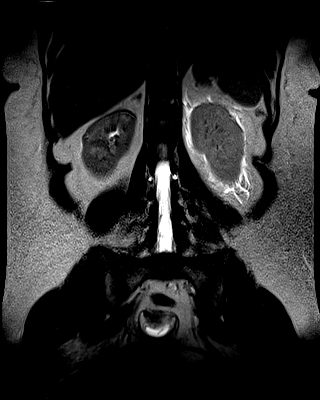
[im 29/44]
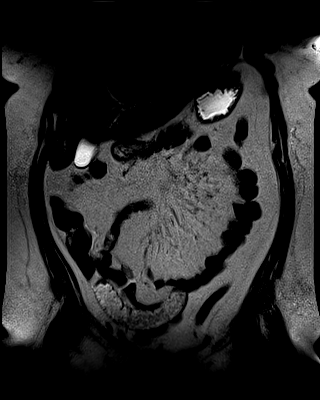
[im 44/44]
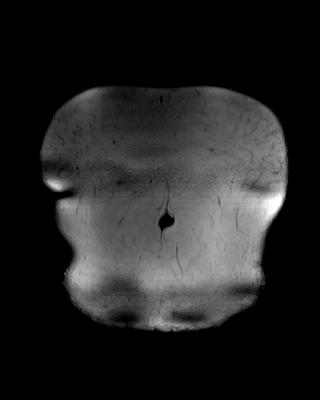

[Series 5: T2 · axial · 4.0mm · 1.00mm/px · z∈[-352,-82]mm · 5 of 55 slices shown (1 of 2)]
[im 1/55]
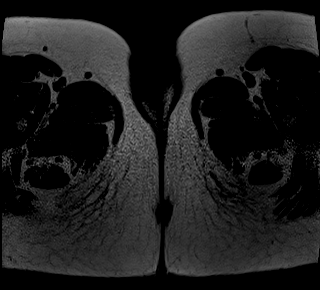
[im 14/55]
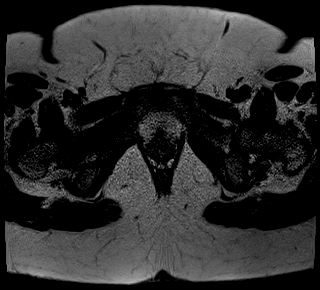
[im 28/55]
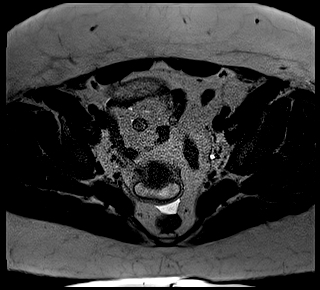
[im 41/55]
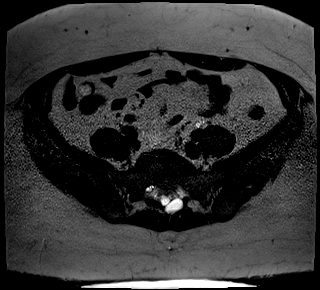
[im 55/55]
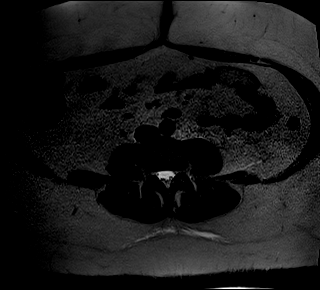

[Series 6: T2 fat-sat · axial · 4.0mm · 1.00mm/px · z∈[-352,-82]mm · 4 of 55 slices shown]
[im 1/55]
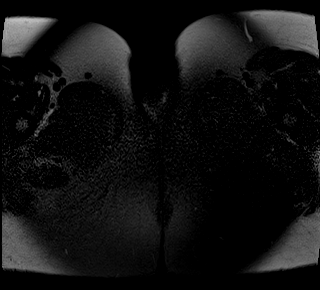
[im 19/55]
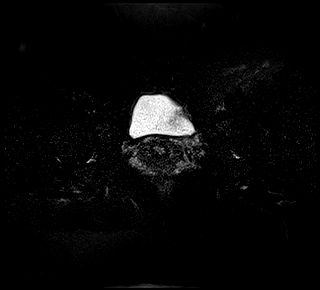
[im 37/55]
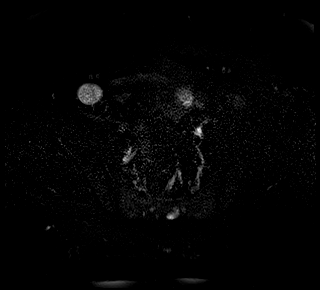
[im 55/55]
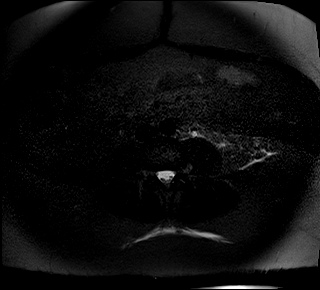

[Series 7: bSSFP · axial · 4.0mm · 0.62mm/px · z∈[-352,-82]mm · 4 of 55 slices shown (1 of 2)]
[im 1/55]
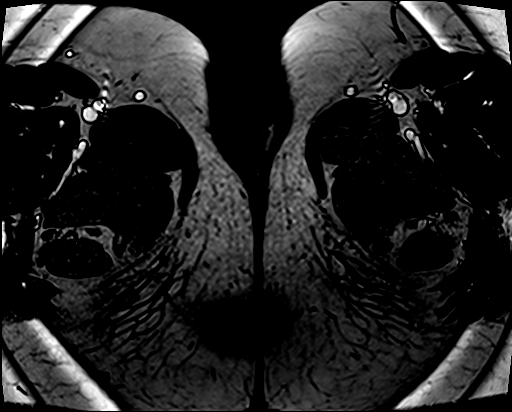
[im 19/55]
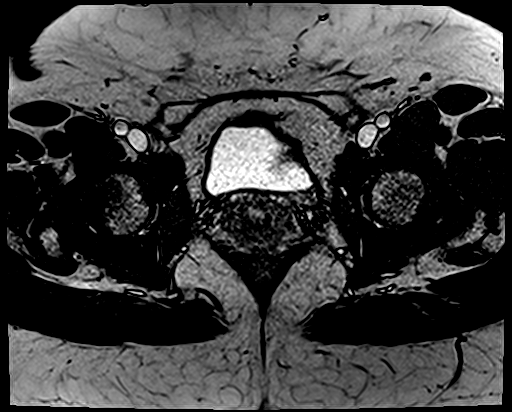
[im 37/55]
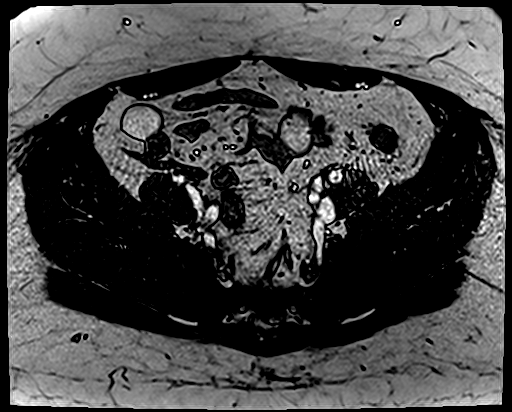
[im 55/55]
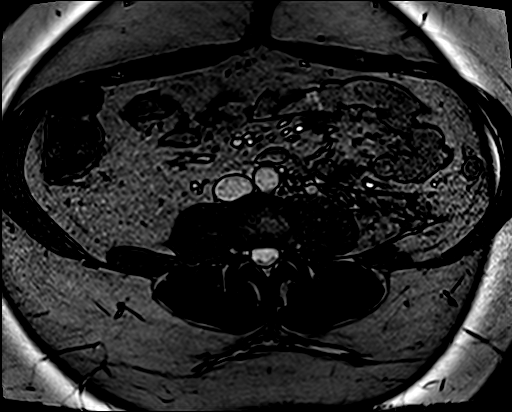

[Series 8: T1 dynamic fat-sat · axial · 3.0mm · 1.12mm/px · z∈[-299,+196]mm · 8 of 176 slices shown]
[im 1/176]
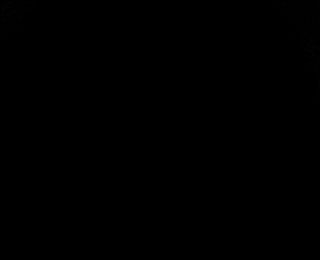
[im 30/176]
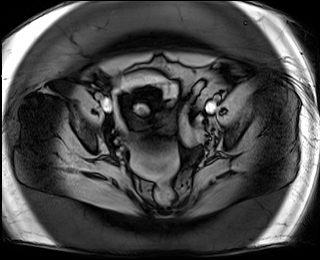
[im 59/176]
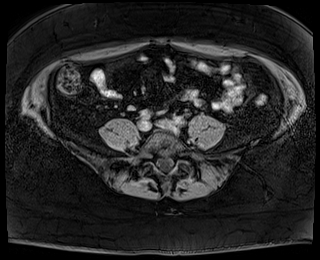
[im 73/176]
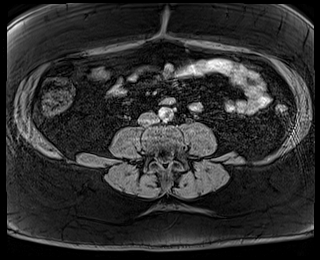
[im 103/176]
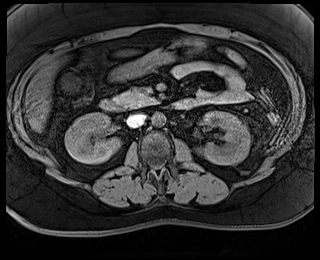
[im 117/176]
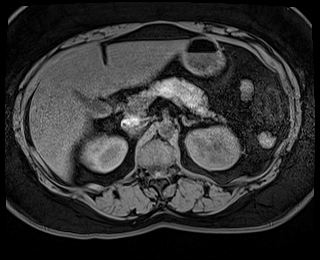
[im 146/176]
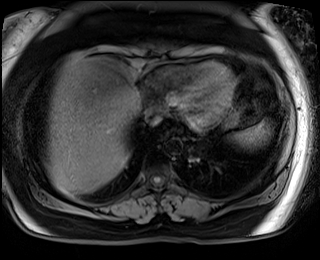
[im 176/176]
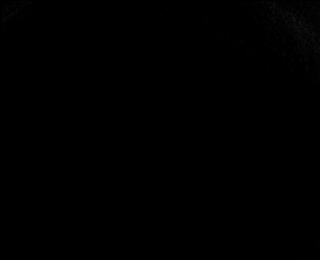

[Series 9: cor haste fs · coronal · 5.0mm · 1.25mm/px · 3 of 43 slices shown]
[im 1/43]
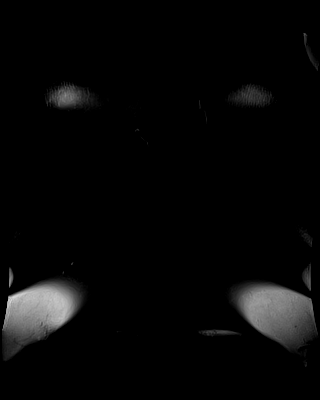
[im 22/43]
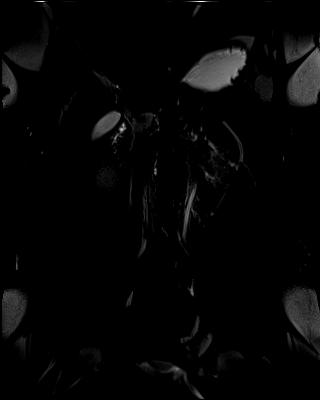
[im 43/43]
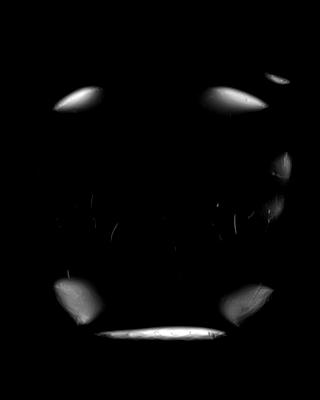

[Series 10: bSSFP · coronal · 5.0mm · 0.62mm/px · 3 of 44 slices shown (2 of 2)]
[im 1/44]
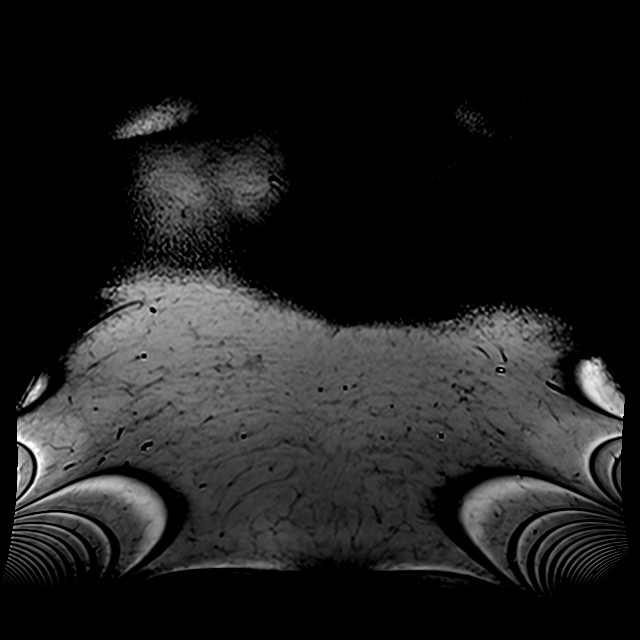
[im 22/44]
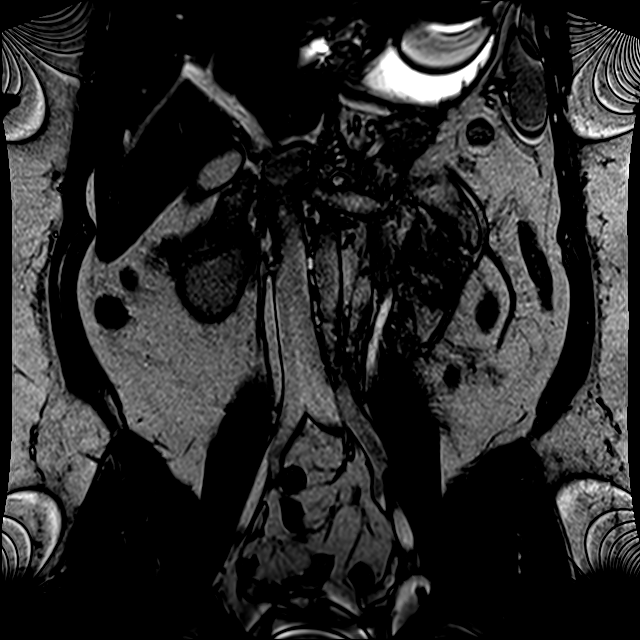
[im 44/44]
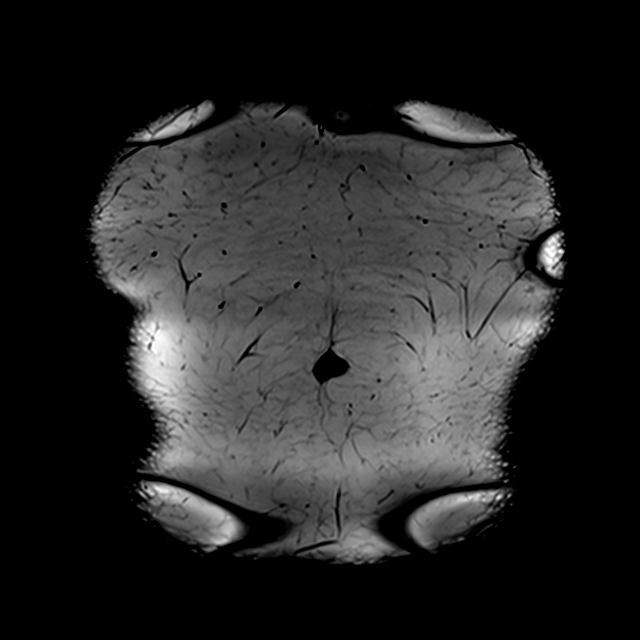

[Series 11: T2 · axial · 5.0mm · 1.00mm/px · z∈[-87,+189]mm · 3 of 47 slices shown (2 of 2)]
[im 1/47]
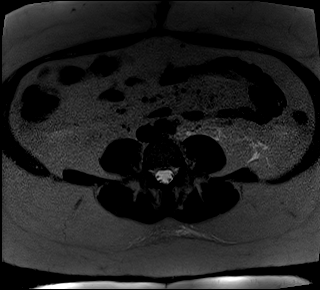
[im 24/47]
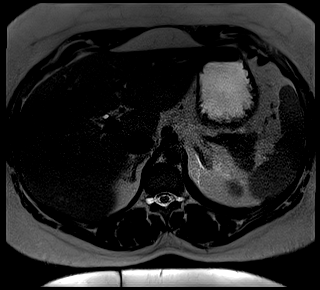
[im 47/47]
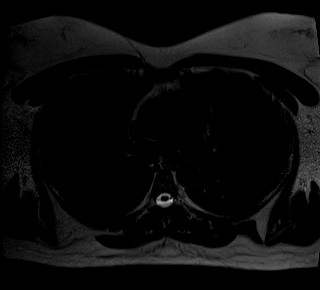

[34 of 48 positions shown; findings below may reference images not displayed]

FINDINGS: COMBINED FINDINGS FOR BOTH MR ABDOMEN AND PELVIS

Lower chest: No acute findings.

Hepatobiliary: No mass or other parenchymal abnormality identified.
The gallbladder is unremarkable. No gallstones identified. No
gallbladder wall thickening or biliary ductal dilatation.

Pancreas: No pancreatic inflammation, mass, or main duct dilatation.

Spleen:  Within normal limits in size and appearance.

Adrenals/Urinary Tract:  Normal appearance of the adrenal glands.

There is a normal appearance of the right kidney. Asymmetric left
nephromegaly, mild hydronephrosis and perinephric fat stranding.
Left hydroureter to the urinary bladder is noted.

Stomach/Bowel: Stomach is normal. The appendix is visualized and is
unremarkable. No bowel wall thickening, inflammation or distension.

Vascular/Lymphatic: No pathologically enlarged lymph nodes
identified. No abdominal aortic aneurysm demonstrated.

Reproductive: Gravid uterus containing gestational sac is
identified. Possible fetal pole within the gestational sac measuring
approximately 6 mm. No adnexal mass.

Other:  No free fluid or fluid collection

Musculoskeletal: No suspicious bone lesions identified.
IMPRESSION: 1. Normal appendix.
2. Asymmetric left nephromegaly, mild hydronephrosis, perinephric
fat stranding and hydroureter to the level of the urinary bladder.
Findings are likely secondary to either ureteral calculi or
pyelonephritis. In the absence of signs or symptoms of infection,
small distal ureteral calculi or recently passed calculi is
suspected. Careful clinical correlation advised.
3. Gravid uterus containing gestational sac. Possible fetal pole
within the gestational sac measuring approximately 6 mm.

## 2020-08-26 MED ORDER — SODIUM CHLORIDE 0.9 % IV BOLUS
1000.0000 mL | Freq: Once | INTRAVENOUS | Status: AC
  Administered 2020-08-26: 1000 mL via INTRAVENOUS

## 2020-08-26 MED ORDER — MORPHINE SULFATE (PF) 4 MG/ML IV SOLN
4.0000 mg | Freq: Once | INTRAVENOUS | Status: AC
  Administered 2020-08-26: 4 mg via INTRAVENOUS
  Filled 2020-08-26: qty 1

## 2020-08-26 MED ORDER — ONDANSETRON HCL 4 MG/2ML IJ SOLN
4.0000 mg | Freq: Once | INTRAMUSCULAR | Status: AC
  Administered 2020-08-26: 4 mg via INTRAVENOUS
  Filled 2020-08-26: qty 2

## 2020-08-26 MED ORDER — OXYCODONE-ACETAMINOPHEN 5-325 MG PO TABS: 1.0000 | ORAL_TABLET | ORAL | 0 refills | Status: DC | PRN

## 2020-08-26 MED ORDER — METOCLOPRAMIDE HCL 10 MG PO TABS: 10.0000 mg | ORAL_TABLET | Freq: Four times a day (QID) | ORAL | 0 refills | Status: DC | PRN

## 2020-08-26 NOTE — ED Notes (Addendum)
Pt at U/S

## 2020-08-26 NOTE — ED Provider Notes (Signed)
[facility] Emergency Department Provider Note  ____________________________________________  Time seen: Approximately 8:11 AM  I have reviewed the triage vital signs and the nursing notes.   HISTORY  Chief Complaint Flank Pain and Urinary Frequency    HPI Kaylee Campos is a 39 y.o. female with a history of hypertension diabetes and currently roughly [redacted] weeks pregnant who comes ED complaining of left flank pain radiating to her left lower quadrant.  This is the third episode of this pain that she has had in the past 2 weeks.  The first 2 episodes lasted about 4 hours.  This most recent episode started at about 9:30 PM last night and has been constant, waxing and waning, no aggravating or alleviating factors.  Associated with urinary frequency but no dysuria.  Denies vaginal bleeding, discharge, or leakage of fluid.  No chest pain shortness of breath fevers or chills.      Past Medical History:  Diagnosis Date   Anxiety    Diabetes mellitus without complication (HCC)    Hyperlipidemia    Hypertension      There are no problems to display for this patient.    No past surgical history on file.   Prior to Admission medications   Medication Sig Start Date End Date Taking? Authorizing Provider  hydrochlorothiazide (MICROZIDE) 12.5 MG capsule Take 12.5 mg by mouth daily.   Yes [provider]  losartan (COZAAR) 25 MG tablet Take 25 mg by mouth daily. 07/31/20  Yes [provider]  Prenatal Vit-Fe Fumarate-FA (MULTIVITAMIN-PRENATAL) 27-0.8 MG TABS tablet Take 1 tablet by mouth daily at 12 noon.   Yes [provider]  cetirizine (ZYRTEC) 10 MG tablet Take 10 mg by mouth daily.    [provider]  metoCLOPramide (REGLAN) 10 MG tablet Take 1 tablet (10 mg total) by mouth every 6 (six) hours as needed. 08/26/20   Sharman Cheek, MD     Allergies Patient has no known allergies.   Family History  Problem Relation  Age of Onset   Diabetes Father    Diabetes Paternal Grandfather     Social History Social History   Tobacco Use   Smoking status: Never Smoker   Smokeless tobacco: Never Used  Substance Use Topics   Alcohol use: Yes    Alcohol/week: 1.0 - 2.0 standard drink    Types: 1 - 2 Glasses of wine per week    Comment: occasionally   Drug use: Not on file    Review of Systems  Constitutional:   No fever or chills.  ENT:   No sore throat. No rhinorrhea. Cardiovascular:   No chest pain or syncope. Respiratory:   No dyspnea or cough. Gastrointestinal:   Left flank pain as above with vomiting.  No constipation or diarrhea Musculoskeletal:   Negative for focal pain or swelling All other systems reviewed and are negative except as documented above in ROS and HPI.  ____________________________________________   PHYSICAL EXAM:  VITAL SIGNS: ED Triage Vitals  Enc Vitals Group     BP 08/26/20 0133 (!) 172/74     Pulse Rate 08/26/20 0133 96     Resp 08/26/20 0133 20     Temp 08/26/20 0133 99.1 F (37.3 C)     Temp Source 08/26/20 0133 Oral     SpO2 08/26/20 0133 96 %     Weight 08/26/20 0134 260 lb (117.9 kg)     Height 08/26/20 0134 5\' 5"  (1.651 m)     Head  Circumference --      Peak Flow --      Pain Score 08/26/20 0133 8     Pain Loc --      Pain Edu? --      Excl. in GC? --     Vital signs reviewed, nursing assessments reviewed.   Constitutional:   Alert and oriented. Non-toxic appearance. Eyes:   Conjunctivae are normal. EOMI. PERRL. ENT      Head:   Normocephalic and atraumatic.      Nose:   Wearing a mask.      Mouth/Throat:   Wearing a mask.      Neck:   No meningismus. Full ROM. Hematological/Lymphatic/Immunilogical:   No cervical lymphadenopathy. Cardiovascular:   RRR. Symmetric bilateral radial and DP pulses.  No murmurs. Cap refill less than 2 seconds. Respiratory:   Normal respiratory effort without tachypnea/retractions. Breath sounds are clear and  equal bilaterally. No wheezes/rales/rhonchi. Gastrointestinal:   Soft with left mid abdominal tenderness. Non distended. There is left CVA tenderness.  No rebound, rigidity, or guarding.  Musculoskeletal:   Normal range of motion in all extremities. No joint effusions.  No lower extremity tenderness.  No edema.  There is tenderness at the left lower back reproducing her pain. Neurologic:   Normal speech and language.  Motor grossly intact. No acute focal neurologic deficits are appreciated.  Skin:    Skin is warm, dry and intact. No rash noted.  No petechiae, purpura, or bullae.  ____________________________________________    LABS (pertinent positives/negatives) (all labs ordered are listed, but only abnormal results are displayed) Labs Reviewed  CBC - Abnormal; Notable for the following components:      Result Value   WBC 17.0 (*)    All other components within normal limits  COMPREHENSIVE METABOLIC PANEL - Abnormal; Notable for the following components:   CO2 20 (*)    Glucose, Bld 178 (*)    Total Protein 8.3 (*)    ALT 45 (*)    All other components within normal limits  URINALYSIS, COMPLETE (UACMP) WITH MICROSCOPIC - Abnormal; Notable for the following components:   Color, Urine YELLOW (*)    APPearance HAZY (*)    Ketones, ur 20 (*)    Protein, ur 30 (*)    Bacteria, UA FEW (*)    All other components within normal limits  HCG, QUANTITATIVE, PREGNANCY - Abnormal; Notable for the following components:   hCG, Beta Chain, Quant, S 45,465 (*)    All other components within normal limits  POCT PREGNANCY, URINE - Abnormal; Notable for the following components:   Preg Test, Ur POSITIVE (*)    All other components within normal limits  POC URINE PREG, ED   ____________________________________________   EKG    ____________________________________________    RADIOLOGY  US Renal  Result Date: 08/26/2020 CLINICAL DATA:  Left flank pain, urinary frequency EXAM: RENAL  / URINARY TRACT ULTRASOUND COMPLETE COMPARISON:  None. FINDINGS: Right Kidney: Renal measurements: 12.1 x 4.8 x 6.5 cm = volume: 195 mL. Echogenicity within normal limits. No mass, shadowing stone, or hydronephrosis visualized. Left Kidney: Renal measurements: 12.6 x 7.0 x 6.1 cm = volume: 282 mL. Echogenicity within normal limits. No mass, shadowing stone, or hydronephrosis visualized. Bladder: Appears normal for degree of bladder distention. Other: The visualized portion of the hepatic parenchyma appears diffusely echogenic. IMPRESSION: 1. Normal sonographic appearance of the kidneys and bladder without evidence of obstructive uropathy. 2. Visualized portion of the liver is diffusely  echogenic. Findings are nonspecific but most commonly seen with hepatic steatosis. Electronically Signed   By: Duanne Guess D.O.   On: 08/26/2020 09:23    ____________________________________________   PROCEDURES Procedures  ____________________________________________  DIFFERENTIAL DIAGNOSIS   Ureterolithiasis, cystitis, diverticulitis, muscle strain, dehydration  CLINICAL IMPRESSION / ASSESSMENT AND PLAN / ED COURSE  Medications ordered in the ED: Medications  sodium chloride 0.9 % bolus 1,000 mL (1,000 mLs Intravenous Bolus 08/26/20 0756)  ondansetron (ZOFRAN) injection 4 mg (4 mg Intravenous Given 08/26/20 0759)  morphine 4 MG/ML injection 4 mg (4 mg Intravenous Given 08/26/20 0758)  morphine 4 MG/ML injection 4 mg (4 mg Intravenous Given 08/26/20 1143)  ondansetron (ZOFRAN) injection 4 mg (4 mg Intravenous Given 08/26/20 1143)    Pertinent labs & imaging results that were available during my care of the patient were reviewed by me and considered in my medical decision making (see chart for details).  Kaylee Campos was evaluated in Emergency Department on 08/26/2020 for the symptoms described in the history of present illness. She was evaluated in the context of the global COVID-19 pandemic, which  necessitated consideration that the patient might be at risk for infection with the SARS-CoV-2 virus that causes COVID-19. Institutional protocols and algorithms that pertain to the evaluation of patients at risk for COVID-19 are in a state of rapid change based on information released by regulatory bodies including the CDC and federal and state organizations. These policies and algorithms were followed during the patient's care in the ED.   Patient presents with left flank and left abdominal pain with vomiting.  Vital signs unremarkable.  Lab panel unremarkable except for leukocytosis of 17,000 which may be related to vomiting.  Urinalysis unremarkable except for moderate ketones consistent with dehydration from the last 12 hours of symptoms.  We will obtain ultrasound renal to evaluate for kidney stone.  UA shows no sign of infection, no red cells.  Will give IV morphine 4 mg for pain control and IV Zofran for nausea control along with IV fluids for hydration.  If ultrasound negative and patient still symptomatic, may need MRI of the abdomen.   ----------------------------------------- 2:50 PM on 08/26/2020 -----------------------------------------  Patient still had significant pain requiring repeat dosing of morphine after negative ultrasound was obtained.  UA does show calcium oxalate crystals, but negative for red cells, somewhat equivocal for renal colic as a cause of her symptoms.  MRI abdomen pelvis ordered, still waiting for study to be performed.  Vital signs remained stable.  Patient will be signed out to oncoming physician at the end of my shift.  Would plan for follow-up with her obstetrics clinic this week if MRI is reassuring.      ____________________________________________   FINAL CLINICAL IMPRESSION(S) / ED DIAGNOSES    Final diagnoses:  Left lower quadrant abdominal pain  First trimester pregnancy     ED Discharge Orders         Ordered    metoCLOPramide (REGLAN)  10 MG tablet  Every 6 hours PRN        08/26/20 1450          Portions of this note were generated with dragon dictation software. Dictation errors may occur despite best attempts at proofreading.   Sharman Cheek, MD 08/26/20 1451

## 2020-08-26 NOTE — ED Notes (Signed)
Pt resting comfortably at this time. Pt states improvement of pain with second dose of morphine. No distress noted. Call bell in reach. Husband at bedside.

## 2020-08-26 NOTE — ED Notes (Signed)
Pt presents to ED with c/o of L sided flank pain. Pt denies HX of kidney stones. Pt does states she is pregnant but is unsure of how far along. Pt denies ABD pain or vaginal bleeding. Pt also has c/o of increased urine frequency and urgency. Pt is A&Ox4. No current distress noted.

## 2020-08-26 NOTE — ED Triage Notes (Signed)
Pt in with co urinary frequency and urgency. Also having left flank pain that started tonight, denies any fever. Recently found out she is pregnant, unsure of gestation. Denies any vaginal bleeding at this time.

## 2020-08-26 NOTE — ED Provider Notes (Signed)
-----------------------------------------   3:07 PM on 08/26/2020 -----------------------------------------  Blood pressure 131/87, pulse 97, temperature 98.9 F (37.2 C), temperature source Oral, resp. rate 20, height 5\' 5"  (1.651 m), weight 117.9 kg, SpO2 95 %.  Assuming care from Dr. .  In short, Kaylee Campos is a 39 y.o. female with a chief complaint of Flank Pain and Urinary Frequency .  Refer to the original H&P for additional details.  The current plan of care is to follow-up 30 and MRI results for abdominal pain in pregnancy.  ----------------------------------------- 5:10 PM on 08/26/2020 -----------------------------------------  MRI shows mild hydroureter and hydronephrosis on the left with no obvious stone.  Differential includes nephrolithiasis as well as pyelonephritis, but patient denies any infectious symptoms, has been afebrile, and UA does not appear consistent with infection.  We will send urine for culture but hold off on antibiotics for now.  I suspect her symptoms are due to small distal kidney stone.  Ultrasound was also performed and shows intrauterine pregnancy with appropriate fetal heart rate.  On reassessment, patient reports pain is much improved and given this she is appropriate for discharge home with urology and OB/GYN follow-up.  She will be prescribed a short course of Percocet and was prescribed Reglan by previous provider.  She was counseled to return to the ED for any infectious symptoms or other worsening symptoms, patient agrees with plan.    08/28/2020, MD 08/26/20 [phone]

## 2020-08-26 NOTE — ED Notes (Addendum)
D/C discussed with pt and spouse, pt verbalized understanding. VSS. No distress noted. No signature pad in room for signature.

## 2020-08-27 LAB — URINE CULTURE

## 2020-09-02 NOTE — Progress Notes (Signed)
09/03/2020 2:28 PM   Kaylee Campos 1981-11-11 [id number]  Referring provider: Marisue Ivan, MD [id number] [facility] [address] [facility] [place],  [place] [address] No chief complaint on file.   HPI: Kaylee Campos is a 39 y.o. female who present for evaluation and management of hematuria and left sided flank pain. Patient is accompanied by her mother.   Patient was presented to the ED on 08/26/2020 for left flank pain and urinary frequency. Patient was roughly [redacted] weeks pregnant. She had left flank pain radiating to her left lower quadrant. This was the third episode of this pain that she had in the past 2 weeks.  The first 2 episodes lasted about 4 hours.  This most recent episode started at about 9:30 PM at night and had been constant, waxing and waning, no aggravating or alleviating factors.  Associated with urinary frequency but no dysuria. Denied vaginal bleeding, discharge, or leakage of fluid.  No chest pain shortness of breath fevers or chills.  UA showed a few bacteria and 6-10 squamous epithelial cells. Urine culture noted multiple species present. HCG quantitiative was 45,465.  RUS noted normal sonographic appearance of the kidneys and bladder without evidence of obstructive uropathy. Visualized portion of the liver is diffusely echogenic. Findings are nonspecific but most commonly seen with hepatic steatosis.  Abdominal and pelvis MRI showed normal appendix. Asymmetric left nephromegaly, mild hydronephrosis, perinephric fat stranding and hydroureter to the level of the urinary bladder. Findings are likely secondary to either ureteral calculi or pyelonephritis. In the absence of signs or symptoms of infection, small distal ureteral calculi or recently passed calculi is suspected. Careful clinical correlation advised. Gravid uterus containing gestational sac. Possible fetal pole within the gestational sac measuring approximately 6 mm.  Left flank pain started on 08/11/2020  with associated occasional vomiting and nausea. She has occasional chills but denies fevers. No dysuria or hematuria. She is taking Percocet for pain. She had Reglan for nausea. She is pushing fluids. With pain she has trickling weak stream.  Her mother noted she has vaginal discharge and spotting. She reports Dr. Dalbert Garnet was made aware of current situation.   This is her first child and states her pregnancy is considered high risk.   PMH: Past Medical History:  Diagnosis Date  . Anxiety   . Diabetes mellitus without complication (HCC)   . Hyperlipidemia   . Hypertension     Surgical History: History reviewed. No pertinent surgical history.  Home Medications:  Allergies as of 09/03/2020   No Known Allergies     Medication List       Accurate as of September 03, 2020 11:59 PM. If you have any questions, ask your nurse or doctor.        STOP taking these medications   hydrochlorothiazide 12.5 MG capsule Commonly known as: MICROZIDE Stopped by: Vanna Scotland, MD   losartan 25 MG tablet Commonly known as: COZAAR Stopped by: Vanna Scotland, MD   multivitamin-prenatal 27-0.8 MG Tabs tablet Stopped by: Vanna Scotland, MD     TAKE these medications   cetirizine 10 MG tablet Commonly known as: ZYRTEC Take 10 mg by mouth daily.   metoCLOPramide 10 MG tablet Commonly known as: REGLAN Take 1 tablet (10 mg total) by mouth every 6 (six) hours as needed.   NIFEdipine 30 MG 24 hr tablet Commonly known as: PROCARDIA-XL/NIFEDICAL-XL Take 30 mg by mouth daily.   oxycodone 5 MG capsule Commonly known as: OXY-IR Take 1 capsule (5 mg total) by mouth  every 4 (four) hours as needed (breakthrough pain only). Started by: Vanna Scotland, MD   oxyCODONE-acetaminophen 5-325 MG tablet Commonly known as: Percocet Take 1 tablet by mouth every 4 (four) hours as needed for severe pain.   Prenatal (w/Iron & FA) 27-0.8 MG Tabs Take 1 tablet by mouth daily.   tamsulosin 0.4 MG Caps  capsule Commonly known as: FLOMAX Take 1 capsule (0.4 mg total) by mouth daily. Started by: Vanna Scotland, MD       Allergies: No Known Allergies  Family History: Family History  Problem Relation Age of Onset  . Diabetes Father   . Diabetes Paternal Grandfather     Social History:  reports that she has never smoked. She has never used smokeless tobacco. She reports current alcohol use of about 1.0 - 2.0 standard drink of alcohol per week. No history on file for drug use.   Physical Exam: BP 124/85   Pulse 99   Ht 5\' 5"  (1.651 m)   Wt 254 lb (115.2 kg)   LMP  (LMP Unknown)   BMI 42.27 kg/m   Constitutional:  Alert and oriented, No acute distress. HEENT: [place] AT, moist mucus membranes.  Trachea midline, no masses. Cardiovascular: No clubbing, cyanosis, or edema. Respiratory: Normal respiratory effort, no increased work of breathing. Skin: No rashes, bruises or suspicious lesions. Neurologic: Grossly intact, no focal deficits, moving all 4 extremities. Psychiatric: Normal mood and affect.  Laboratory Data:  Lab Results  Component Value Date   CREATININE 0.81 08/26/2020    Urinalysis Results for orders placed or performed in visit on 09/03/20  CULTURE, URINE COMPREHENSIVE   Specimen: Urine   UR  Result Value Ref Range   Urine Culture, Comprehensive Preliminary report    Organism ID, Bacteria Comment   Microscopic Examination   Urine  Result Value Ref Range   WBC, UA 11-30 (A) 0 - 5 /hpf   RBC 11-30 (A) 0 - 2 /hpf   Epithelial Cells (non renal) 0-10 0 - 10 /hpf   Renal Epithel, UA 0-10 (A) None seen /hpf   Casts Present (A) None seen /lpf   Cast Type Granular casts (A) N/A   Bacteria, UA Many (A) None seen/Few  Urinalysis, Complete  Result Value Ref Range   Specific Gravity, UA 1.025 1.005 - 1.030   pH, UA 6.0 5.0 - 7.5   Color, UA Orange Yellow   Appearance Ur Cloudy (A) Clear   Leukocytes,UA 1+ (A) Negative   Protein,UA 2+ (A) Negative/Trace   Glucose,  UA Trace (A) Negative   Ketones, UA Trace (A) Negative   RBC, UA 2+ (A) Negative   Bilirubin, UA Negative Negative   Urobilinogen, Ur 1.0 0.2 - 1.0 mg/dL   Nitrite, UA Negative Negative   Microscopic Examination See below:      Pertinent Imaging:  Results for orders placed during the hospital encounter of 08/26/20  08/28/20 Renal  Narrative CLINICAL DATA:  Left flank pain, urinary frequency  EXAM: RENAL / URINARY TRACT ULTRASOUND COMPLETE  COMPARISON:  None.  FINDINGS: Right Kidney:  Renal measurements: 12.1 x 4.8 x 6.5 cm = volume: 195 mL. Echogenicity within normal limits. No mass, shadowing stone, or hydronephrosis visualized.  Left Kidney:  Renal measurements: 12.6 x 7.0 x 6.1 cm = volume: 282 mL. Echogenicity within normal limits. No mass, shadowing stone, or hydronephrosis visualized.  Bladder:  Appears normal for degree of bladder distention.  Other:  The visualized portion of the hepatic parenchyma appears diffusely echogenic.  IMPRESSION: 1. Normal sonographic appearance of the kidneys and bladder without evidence of obstructive uropathy. 2. Visualized portion of the liver is diffusely echogenic. Findings are nonspecific but most commonly seen with hepatic steatosis.   Electronically Signed By: Duanne Guess D.O. On: 08/26/2020 09:23  I have personally reviewed the images and agree with radiologist interpretation.     Assessment & Plan:    1. Nephrolithiasis - suspected left ureteral stone in pregnancy  Based on the nature of her pain, location, radiographic findings, suggesting possible ureteral calculus exact size and location and.  We discussed the role of CT scan, would prefer to avoid during early pregnancy if at all possible.  We discussed this is fairly low radiation dose.  She may consider this down the road.   Patient made aware that surgical intervention is preferred in the second trimester. Discussed temporary nephrostomy tube to  relieve pressure/pain.    We discussed various treatment options including ureteroscopy, laser lithotripsy, and stent. We discussed the risks and benefits of both including bleeding, infection, damage to surrounding structures, efficacy with need for possible further intervention, and need for temporary ureteral stent.  We discussed general stone prevention techniques including drinking plenty water with goal of producing 2.5 L urine daily, increased citric acid intake, avoidance of high oxalate containing foods, and decreased salt intake.  Information about dietary recommendations given today.   Start Flomax 0.4 mg daily. Patient reassured this reasonably is safe for pregnancy although no clear data. Patient given a Strainer.  We discussed the pain management plan today.  She will use Percocet as needed.  The prescription for breakthrough oxycodone only.  She will use sparingly.  Warning symptoms reviewed.  Discussion today was lengthy.  Complex situation.  -Urine culture sent.   Follow up in 5 weeks or sooner as needed   [facility] [address] [place], [place] [address] [phone]  I, Theador Hawthorne, am acting as a scribe for Dr. Vanna Scotland.  I have reviewed the above documentation for accuracy and completeness, and I agree with the above.   Vanna Scotland, MD  I spent 45 total minutes on the day of the encounter including pre-visit review of the medical record, face-to-face time with the patient, and post visit ordering of labs/imaging/tests.

## 2020-09-03 ENCOUNTER — Encounter: Payer: Self-pay | Admitting: Urology

## 2020-09-03 ENCOUNTER — Other Ambulatory Visit: Payer: Self-pay

## 2020-09-03 ENCOUNTER — Ambulatory Visit (INDEPENDENT_AMBULATORY_CARE_PROVIDER_SITE_OTHER): Payer: 59 | Admitting: Urology

## 2020-09-03 VITALS — BP 124/85 | HR 99 | Ht 65.0 in | Wt 254.0 lb

## 2020-09-03 DIAGNOSIS — N200 Calculus of kidney: Secondary | ICD-10-CM | POA: Diagnosis not present

## 2020-09-03 DIAGNOSIS — R3129 Other microscopic hematuria: Secondary | ICD-10-CM | POA: Diagnosis not present

## 2020-09-03 DIAGNOSIS — O26831 Pregnancy related renal disease, first trimester: Secondary | ICD-10-CM

## 2020-09-03 MED ORDER — TAMSULOSIN HCL 0.4 MG PO CAPS: 0.4000 mg | ORAL_CAPSULE | Freq: Every day | ORAL | 0 refills | Status: DC

## 2020-09-04 ENCOUNTER — Ambulatory Visit: Payer: 59 | Admitting: Urology

## 2020-09-04 LAB — MICROSCOPIC EXAMINATION

## 2020-09-04 LAB — URINALYSIS, COMPLETE
Bilirubin, UA: NEGATIVE
Nitrite, UA: NEGATIVE
Specific Gravity, UA: 1.025 (ref 1.005–1.030)
Urobilinogen, Ur: 1 mg/dL (ref 0.2–1.0)
pH, UA: 6 (ref 5.0–7.5)

## 2020-09-05 MED ORDER — OXYCODONE HCL 5 MG PO CAPS: 5.0000 mg | ORAL_CAPSULE | ORAL | 0 refills | Status: DC | PRN

## 2020-09-06 LAB — CULTURE, URINE COMPREHENSIVE

## 2020-09-25 ENCOUNTER — Encounter: Payer: Self-pay | Admitting: *Deleted

## 2020-09-25 ENCOUNTER — Encounter: Payer: 59 | Attending: Obstetrics and Gynecology | Admitting: *Deleted

## 2020-09-25 ENCOUNTER — Other Ambulatory Visit: Payer: Self-pay

## 2020-09-25 VITALS — BP 118/86 | Ht 65.0 in | Wt 248.9 lb

## 2020-09-25 DIAGNOSIS — O24414 Gestational diabetes mellitus in pregnancy, insulin controlled: Secondary | ICD-10-CM | POA: Diagnosis not present

## 2020-09-25 DIAGNOSIS — O0991 Supervision of high risk pregnancy, unspecified, first trimester: Secondary | ICD-10-CM | POA: Insufficient documentation

## 2020-09-25 DIAGNOSIS — O24111 Pre-existing diabetes mellitus, type 2, in pregnancy, first trimester: Secondary | ICD-10-CM

## 2020-09-25 NOTE — Progress Notes (Signed)
Diabetes Self-Management Education  Visit Type: First/Initial  Appt. Start Time: 0850 Appt. End Time: 1030  09/25/2020  [name], identified by name and date of birth, is a 39 y.o. female with a diagnosis of Diabetes: Type 2 (Pregnant).   ASSESSMENT  Blood pressure 118/86, height 5\' 5"  (1.651 m), weight 248 lb 14.4 oz (112.9 kg),  Body mass index is 41.42 kg/m.  Estimated date of delivery 04/12/2021   Diabetes Self-Management Education - 09/25/20 1203      Visit Information   Visit Type First/Initial      Initial Visit   Diabetes Type Type 2   Pregnant   Are you currently following a meal plan? Yes    What type of meal plan do you follow? "more water, eat less at each meal; watch carbs"    Are you taking your medications as prescribed? Yes    Date Diagnosed Diabetes 1 1/2 years - pregnancy 11 weeks      Health Coping   How would you rate your overall health? Good      Psychosocial Assessment   Patient Belief/Attitude about Diabetes Motivated to manage diabetes   "ups and downs"   Self-care barriers None    Self-management support Doctor's office;Family    Patient Concerns Nutrition/Meal planning;Glycemic Control;Monitoring    Special Needs None    Preferred Learning Style Auditory;Visual    Learning Readiness Change in progress    How often do you need to have someone help you when you read instructions, pamphlets, or other written materials from your doctor or pharmacy? 1 - Never    What is the last grade level you completed in school? Bachelors      Pre-Education Assessment   Patient understands the diabetes disease and treatment process. Needs Review    Patient understands incorporating nutritional management into lifestyle. Needs Review    Patient undertands incorporating physical activity into lifestyle. Needs Instruction    Patient understands using medications safely. Needs Review    Patient understands monitoring blood glucose, interpreting and using  results Needs Review    Patient understands prevention, detection, and treatment of acute complications. Needs Instruction    Patient understands prevention, detection, and treatment of chronic complications. Needs Instruction    Patient understands how to develop strategies to address psychosocial issues. Needs Instruction    Patient understands how to develop strategies to promote health/change behavior. Needs Instruction      Complications   Last HgB A1C per patient/outside source 5.8 %   10/02/2020   How often do you check your blood sugar? 3-4 times/day    Fasting Blood glucose range (mg/dL) 10/04/2020   09-323 mg/dL (2/3 elevated)   Postprandial Blood glucose range (mg/dL) 55-732   pp's breakfast 90, 96 mg/dL; pp's lunch 20-254;270-623 mg/dL; pp's supper 76-283 mg/dL (1/3 elevated)   Have you had a dilated eye exam in the past 12 months? No    Have you had a dental exam in the past 12 months? No    Are you checking your feet? No      Dietary Intake   Breakfast cereal and milk; oatmeal and fruit (strawberries, blueberries, grapes, cantaloupe, pineapple)    Snack (morning) 1-2 snacks/day - string cheese and fruit; granola bar    Lunch grilled chicken salad with cheese, crotons and vinegariette dressing; soup; baked potato    Dinner usually meat and 2 vegetables; stir fry beef, 15-176, occasional pork; beans, rice, occasional corn, green beans, zucchini, mushrooms, roasted brocolli  or brussels sprouts    Beverage(s) water      Exercise   Exercise Type ADL's      Patient Education   Previous Diabetes Education Yes (please comment)   2020 for Diabetes education here   Disease state  Definition of diabetes, type 1 and 2, and the diagnosis of diabetes;Factors that contribute to the development of diabetes    Nutrition management  Role of diet in the treatment of diabetes and the relationship between the three main macronutrients and blood glucose level;Food label reading, portion sizes and  measuring food.;Reviewed blood glucose goals for pre and post meals and how to evaluate the patients' food intake on their blood glucose level.;Meal timing in regards to the patients' current diabetes medication.    Physical activity and exercise  Role of exercise on diabetes management, blood pressure control and cardiac health.    Medications Taught/reviewed insulin injection, site rotation, insulin storage and needle disposal.;Reviewed patients medication for diabetes, action, purpose, timing of dose and side effects.   Pt injected 5 units NS subcutaneously to left abdomen without difficulty.   Monitoring Purpose and frequency of SMBG.;Taught/discussed recording of test results and interpretation of SMBG.;Identified appropriate SMBG and/or A1C goals.;Ketone testing, when, how.    Acute complications Taught treatment of hypoglycemia - the 15 rule.    Chronic complications Relationship between chronic complications and blood glucose control    Psychosocial adjustment Identified and addressed patients feelings and concerns about diabetes    Preconception care Pregnancy and GDM  Role of pre-pregnancy blood glucose control on the development of the fetus;Reviewed with patient blood glucose goals with pregnancy;Role of family planning for patients with diabetes      Individualized Goals (developed by patient)   Reducing Risk Other (comment)   improve blood sugars, prevent diabetes complications     Outcomes   Expected Outcomes Demonstrated interest in learning. Expect positive outcomes           Individualized Plan for Diabetes Self-Management Training:   Learning Objective:  Patient will have a greater understanding of diabetes self-management. Patient education plan is to attend individual and/or group sessions per assessed needs and concerns.   Plan:   Patient Instructions  Read booklet on Diabetes Management for Mothers-To-Be Follow Gestational Meal Planning Guidelines Don't skip  meals Complete a 3 Day Food Record and bring to next appointment Check blood sugars 4 x day - before breakfast and 2 hrs after every meal and record  Bring blood sugar log to all appointments Purchase urine ketone strips if instructed by MD and check urine ketones every am:  If + increase bedtime snack to 1 protein and 2 carbohydrate servings Walk 20-30 minutes at least 5 x week if permitted by MD  Carry fast acting glucose and a snack at all times Rotate injection sites Give morning insulin 30 minutes before breakfast and evening insulin 30 minutes before supper  Morning insulin Humulin/Novolin    N  (cloudy)             20     units Humulin/Novolin    R  (clear)              13     units  33     units  Evening insulin Humulin/Novolin    N (cloudy)              8     units Humulin/Novolin    R (clear)               8     units                           16     units  Expected Outcomes:  Demonstrated interest in learning. Expect positive outcomes  Education material provided:  Diabetes Management for Mothers-To-Be Booklet Gestational Meal Planning Guidelines Simple Meal Plan 3 Day Food Record Goals for a Healthy Pregnancy 4 packs of syringes (40) 73mm x 31G; 1/2 mL (50 units) Glucose tablets Symptoms, causes and treatments of Hypoglycemia  If problems or questions, patient to contact team via:  Sharion Settler, RN, CCM, CDCES [phone]  Future DSME appointment:  October 02, 2020 with the dietitian

## 2020-09-25 NOTE — Patient Instructions (Addendum)
Read booklet on Diabetes Management for Mothers-To-Be Follow Gestational Meal Planning Guidelines Don't skip meals Complete a 3 Day Food Record and bring to next appointment Check blood sugars 4 x day - before breakfast and 2 hrs after every meal and record  Bring blood sugar log to all appointments Purchase urine ketone strips if instructed by MD and check urine ketones every am:  If + increase bedtime snack to 1 protein and 2 carbohydrate servings Walk 20-30 minutes at least 5 x week if permitted by MD  Carry fast acting glucose and a snack at all times Rotate injection sites Give morning insulin 30 minutes before breakfast and evening insulin 30 minutes before supper  Morning insulin Humulin/Novolin    N  (cloudy)      20     units Humulin/Novolin    R  (clear)              13     units                                                    33     units  Evening insulin Humulin/Novolin    N (cloudy)              8     units Humulin/Novolin    R (clear)               8     units                           16     units

## 2020-09-28 ENCOUNTER — Telehealth: Payer: Self-pay | Admitting: *Deleted

## 2020-09-28 NOTE — Telephone Encounter (Signed)
Phone call to follow up with patient. She reports no problems with insulin administration. FBG was 105 this morning. She will follow up with OB next week. She denies any symptoms of hypoglycemia. Instructed her to call back for any questions.

## 2020-09-29 ENCOUNTER — Encounter: Payer: Self-pay | Admitting: Urology

## 2020-10-02 ENCOUNTER — Encounter: Payer: Self-pay | Admitting: Dietician

## 2020-10-02 ENCOUNTER — Other Ambulatory Visit: Payer: Self-pay

## 2020-10-02 ENCOUNTER — Encounter: Payer: 59 | Admitting: Dietician

## 2020-10-02 VITALS — BP 125/84 | Ht 65.0 in | Wt 251.4 lb

## 2020-10-02 DIAGNOSIS — O24414 Gestational diabetes mellitus in pregnancy, insulin controlled: Secondary | ICD-10-CM | POA: Diagnosis not present

## 2020-10-02 DIAGNOSIS — O24111 Pre-existing diabetes mellitus, type 2, in pregnancy, first trimester: Secondary | ICD-10-CM

## 2020-10-02 NOTE — Progress Notes (Signed)
.   Patient's BG record indicates fasting BGs ranging 85-105 , and post-meal BGs ranging 87-130; pt started insulin regimen on 09/26/2020 . Patient's food diary indicates quality protein sources with good carb balance and F/V intake, snacks need protein.  . Provided individualized menus based on patient's food preferences. . Instructed patient on food safety, including avoidance of Listeriosis, and limiting mercury from fish. . Discussed importance of maintaining healthy lifestyle habits to reduce risk of Type 2 DM as well as Gestational DM with any future pregnancies. . Advised patient to use any remaining testing supplies to test some BGs after delivery, and to have BG tested ideally annually, as well as prior to attempting future pregnancies.

## 2020-10-13 ENCOUNTER — Ambulatory Visit (INDEPENDENT_AMBULATORY_CARE_PROVIDER_SITE_OTHER): Payer: 59 | Admitting: Urology

## 2020-10-13 ENCOUNTER — Encounter: Payer: Self-pay | Admitting: Urology

## 2020-10-13 ENCOUNTER — Other Ambulatory Visit: Payer: Self-pay

## 2020-10-13 VITALS — BP 129/84 | HR 102

## 2020-10-13 DIAGNOSIS — N200 Calculus of kidney: Secondary | ICD-10-CM | POA: Diagnosis not present

## 2020-10-13 DIAGNOSIS — R8271 Bacteriuria: Secondary | ICD-10-CM

## 2020-10-13 DIAGNOSIS — O99891 Other specified diseases and conditions complicating pregnancy: Secondary | ICD-10-CM

## 2020-10-13 DIAGNOSIS — O26831 Pregnancy related renal disease, first trimester: Secondary | ICD-10-CM | POA: Diagnosis not present

## 2020-10-13 MED ORDER — TAMSULOSIN HCL 0.4 MG PO CAPS: 0.4000 mg | ORAL_CAPSULE | Freq: Every day | ORAL | 0 refills | Status: AC

## 2020-10-13 MED ORDER — CEPHALEXIN 500 MG PO CAPS: 500.0000 mg | ORAL_CAPSULE | Freq: Three times a day (TID) | ORAL | 0 refills | Status: AC

## 2020-10-13 NOTE — Progress Notes (Signed)
10/13/2020 8:25 AM   Merdis Delay 21-Jul-1981 [id number]  Referring provider: Marisue Ivan, MD [phone] [facility] [address] [facility] [place],  [place] [address]  Chief Complaint  Patient presents with  . Follow-up    HPI: 39 year old gravid female who presents today for follow-up of probable kidney stones.  She initially was seen and evaluated in mid October 2021 with left flank pain from presumed stone episode during her first trimester pregnancy.  She underwent an MRI which showed left hydronephrosis, mild with left nephromegaly.  The stone was not visualized.  She was managed conservatively and never required intervention.  She returns today for follow-up.  She reports that she has been doing well since her last visit.  She had one episode of flank pain about a week after her visit but since then, no further episodes.  She feels like the Flomax really helped her.  She is no longer having to take this.  She denies any hematuria dysuria or urinary symptoms.  She reports that she did have labs done yesterday at [facility] and mentioned that they said something about her urine and leukocytosis.  Review of labs at [facility] clinic indicate that she did have a slight leukocytosis of 10.5 and her blood work.    Urine today is mildly suspicious, 3-10 red blood cells, leukocytes, many squamous epithelial cells, nitrate negative, many bacteria.   PMH: Past Medical History:  Diagnosis Date  . Anxiety   . Diabetes mellitus without complication (HCC)   . Hyperlipidemia   . Hypertension     Surgical History: No past surgical history on file.  Home Medications:  Allergies as of 10/13/2020   No Known Allergies     Medication List       Accurate as of October 13, 2020  8:25 AM. If you have any questions, ask your nurse or doctor.        STOP taking these medications   tamsulosin 0.4 MG Caps capsule Commonly known as: FLOMAX Stopped by: Vanna Scotland, MD     TAKE  these medications   aspirin 81 MG EC tablet Take 81 mg by mouth daily.   cetirizine 10 MG tablet Commonly known as: ZYRTEC Take 10 mg by mouth daily.   HumuLIN N 100 UNIT/ML injection Generic drug: insulin NPH Human Inject into the skin 2 (two) times daily before a meal. 20 units in am and 8 units in pm   HumuLIN R 100 units/mL injection Generic drug: insulin regular Inject into the skin 2 (two) times daily before a meal. 13 units in am and 8 units in pm   NIFEdipine 30 MG 24 hr tablet Commonly known as: PROCARDIA-XL/NIFEDICAL-XL Take 30 mg by mouth daily.   Prenatal (w/Iron & FA) 27-0.8 MG Tabs Take 1 tablet by mouth daily.       Allergies: No Known Allergies  Family History: Family History  Problem Relation Age of Onset  . Diabetes Father   . Diabetes Paternal Grandfather     Social History:  reports that she has never smoked. She has never used smokeless tobacco. She reports previous alcohol use of about 1.0 - 2.0 standard drink of alcohol per week. No history on file for drug use.   Physical Exam: BP 129/84   Pulse (!) 102   LMP  (LMP Unknown)   Constitutional:  Alert and oriented, No acute distress.  Accompanied by boyfriend. HEENT: [place] AT, moist mucus membranes.  Trachea midline, no masses. Cardiovascular: No clubbing, cyanosis, or  edema. Respiratory: Normal respiratory effort, no increased work of breathing. Skin: No rashes, bruises or suspicious lesions. Neurologic: Grossly intact, no focal deficits, moving all 4 extremities. Psychiatric: Normal mood and affect.  Laboratory Data: Lab Results  Component Value Date   WBC 17.0 (H) 08/26/2020   HGB 14.0 08/26/2020   HCT 41.8 08/26/2020   MCV 88.0 08/26/2020   PLT 339 08/26/2020    Lab Results  Component Value Date   CREATININE 0.81 08/26/2020   Urinalysis Reviewed, see epic, suspicious for contamination versus infection   Assessment & Plan:    1. Asymptomatic bacteriuria in pregnancy in second  trimester UA mildly suspicious today for contamination versus asymptomatic bacteriuria  Given that she is pregnant, we will going treat with Keflex 3 times daily for 5 days, just as needed based on culture and sensitivity data - Urinalysis, Complete - CULTURE, URINE COMPREHENSIVE  2. Pregnancy complicated by nephrolithiasis in first trimester, antepartum Currently in second trimester without any further flank pain episodes which is reassuring She is continue to push hydration Will provide Flomax prescription to hold, advised to call us ASAP if she develops any flank pain at which point would mow strongly recommend CT scan imaging  Plan for follow-up after she delivers, 6 months with KUB or sooner as needed  - tamsulosin (FLOMAX) 0.4 MG CAPS capsule; Take 1 capsule (0.4 mg total) by mouth daily.  Dispense: 30 capsule; Refill: 0   Vanna Scotland, MD  [facility] [address] [place], [place] [address] [phone]

## 2020-10-13 NOTE — Addendum Note (Signed)
Addended by: Milas Kocher A on: 10/13/2020 08:51 AM   Modules accepted: Orders

## 2020-10-16 ENCOUNTER — Encounter: Payer: Self-pay | Admitting: Urology

## 2020-10-16 LAB — URINALYSIS, COMPLETE
Bilirubin, UA: NEGATIVE
Glucose, UA: NEGATIVE
Nitrite, UA: NEGATIVE
Specific Gravity, UA: 1.025 (ref 1.005–1.030)
Urobilinogen, Ur: 0.2 mg/dL (ref 0.2–1.0)
pH, UA: 6.5 (ref 5.0–7.5)

## 2020-10-16 LAB — MICROSCOPIC EXAMINATION: Epithelial Cells (non renal): >10 /hpf — AB (ref 0–10)

## 2020-10-16 LAB — CULTURE, URINE COMPREHENSIVE

## 2020-10-16 NOTE — Telephone Encounter (Signed)
Called patient to discuss results, reviewed in detail. Instructed per Dr. Delana Meyer note 10/13/20 could be a contaminated sample versus true infection pending culture results. Patient is still taking Keflex as prescribed. Voiced understanding.

## 2020-11-16 ENCOUNTER — Encounter: Payer: Self-pay | Admitting: Urology

## 2021-04-15 ENCOUNTER — Ambulatory Visit: Payer: Self-pay | Admitting: Urology

## 2023-07-05 ENCOUNTER — Other Ambulatory Visit: Payer: Self-pay | Admitting: Obstetrics and Gynecology

## 2023-07-05 DIAGNOSIS — Z1231 Encounter for screening mammogram for malignant neoplasm of breast: Secondary | ICD-10-CM

## 2023-08-11 ENCOUNTER — Ambulatory Visit
Admission: RE | Admit: 2023-08-11 | Discharge: 2023-08-11 | Disposition: A | Discharge status: Home / Self Care | Payer: 59 | Source: Ambulatory Visit | Attending: Obstetrics and Gynecology | Admitting: Obstetrics and Gynecology

## 2023-08-11 DIAGNOSIS — Z1231 Encounter for screening mammogram for malignant neoplasm of breast: Secondary | ICD-10-CM | POA: Insufficient documentation

## 2024-06-28 ENCOUNTER — Other Ambulatory Visit: Payer: Self-pay | Admitting: Obstetrics and Gynecology

## 2024-06-28 DIAGNOSIS — Z1231 Encounter for screening mammogram for malignant neoplasm of breast: Secondary | ICD-10-CM

## 2024-08-30 ENCOUNTER — Ambulatory Visit
Admission: RE | Admit: 2024-08-30 | Discharge: 2024-08-30 | Disposition: A | Discharge status: Home / Self Care | Source: Ambulatory Visit | Attending: Obstetrics and Gynecology | Admitting: Obstetrics and Gynecology

## 2024-08-30 DIAGNOSIS — Z1231 Encounter for screening mammogram for malignant neoplasm of breast: Secondary | ICD-10-CM | POA: Insufficient documentation
